# Patient Record
Sex: Female | Born: 1965 | Race: Black or African American | Hispanic: No | Marital: Single | Smoking: Never smoker
Health system: Southern US, Community
[De-identification: ages and names within clinical notes are randomized; demographics above are authoritative.]

## PROBLEM LIST (undated history)

## (undated) DIAGNOSIS — K219 Gastro-esophageal reflux disease without esophagitis: Secondary | ICD-10-CM

## (undated) DIAGNOSIS — R7303 Prediabetes: Secondary | ICD-10-CM

## (undated) DIAGNOSIS — Z87442 Personal history of urinary calculi: Secondary | ICD-10-CM

## (undated) DIAGNOSIS — I890 Lymphedema, not elsewhere classified: Secondary | ICD-10-CM

## (undated) DIAGNOSIS — G4733 Obstructive sleep apnea (adult) (pediatric): Secondary | ICD-10-CM

## (undated) DIAGNOSIS — M1990 Unspecified osteoarthritis, unspecified site: Secondary | ICD-10-CM

## (undated) DIAGNOSIS — I2510 Atherosclerotic heart disease of native coronary artery without angina pectoris: Secondary | ICD-10-CM

## (undated) DIAGNOSIS — I5189 Other ill-defined heart diseases: Secondary | ICD-10-CM

## (undated) DIAGNOSIS — I219 Acute myocardial infarction, unspecified: Secondary | ICD-10-CM

## (undated) DIAGNOSIS — G5600 Carpal tunnel syndrome, unspecified upper limb: Secondary | ICD-10-CM

## (undated) DIAGNOSIS — I10 Essential (primary) hypertension: Secondary | ICD-10-CM

## (undated) DIAGNOSIS — R5383 Other fatigue: Secondary | ICD-10-CM

## (undated) DIAGNOSIS — N3000 Acute cystitis without hematuria: Secondary | ICD-10-CM

## (undated) DIAGNOSIS — K589 Irritable bowel syndrome without diarrhea: Secondary | ICD-10-CM

## (undated) DIAGNOSIS — M79606 Pain in leg, unspecified: Secondary | ICD-10-CM

## (undated) DIAGNOSIS — R0600 Dyspnea, unspecified: Secondary | ICD-10-CM

## (undated) DIAGNOSIS — E559 Vitamin D deficiency, unspecified: Secondary | ICD-10-CM

## (undated) HISTORY — DX: Acute cystitis without hematuria: N30.00

## (undated) HISTORY — DX: Irritable bowel syndrome without diarrhea: K58.9

## (undated) HISTORY — DX: Other fatigue: R53.83

## (undated) HISTORY — DX: Vitamin D deficiency, unspecified: E55.9

## (undated) HISTORY — DX: Pain in leg, unspecified: M79.606

## (undated) HISTORY — PX: COLONOSCOPY: SHX174

## (undated) HISTORY — DX: Lymphedema, not elsewhere classified: I89.0

## (undated) HISTORY — PX: TUBAL LIGATION: SHX77

---

## 2004-06-04 ENCOUNTER — Ambulatory Visit: Payer: Self-pay | Admitting: Obstetrics and Gynecology

## 2004-11-22 ENCOUNTER — Emergency Department: Payer: Self-pay | Admitting: General Practice

## 2008-09-10 ENCOUNTER — Emergency Department: Payer: Self-pay | Admitting: Emergency Medicine

## 2012-08-05 ENCOUNTER — Ambulatory Visit: Payer: Self-pay | Admitting: Obstetrics and Gynecology

## 2013-08-25 DIAGNOSIS — E559 Vitamin D deficiency, unspecified: Secondary | ICD-10-CM

## 2013-08-25 DIAGNOSIS — M79606 Pain in leg, unspecified: Secondary | ICD-10-CM | POA: Insufficient documentation

## 2013-08-25 DIAGNOSIS — R5383 Other fatigue: Secondary | ICD-10-CM | POA: Insufficient documentation

## 2013-08-25 HISTORY — DX: Vitamin D deficiency, unspecified: E55.9

## 2013-08-25 HISTORY — DX: Other fatigue: R53.83

## 2013-08-25 HISTORY — DX: Pain in leg, unspecified: M79.606

## 2013-10-18 ENCOUNTER — Ambulatory Visit: Payer: Self-pay | Admitting: Physician Assistant

## 2014-05-04 ENCOUNTER — Ambulatory Visit: Payer: Self-pay | Admitting: Physician Assistant

## 2014-12-27 DIAGNOSIS — N3000 Acute cystitis without hematuria: Secondary | ICD-10-CM

## 2014-12-27 HISTORY — DX: Acute cystitis without hematuria: N30.00

## 2015-01-15 ENCOUNTER — Other Ambulatory Visit: Payer: Self-pay | Admitting: Obstetrics and Gynecology

## 2015-01-15 DIAGNOSIS — Z1231 Encounter for screening mammogram for malignant neoplasm of breast: Secondary | ICD-10-CM

## 2015-01-19 ENCOUNTER — Ambulatory Visit (INDEPENDENT_AMBULATORY_CARE_PROVIDER_SITE_OTHER): Payer: Federal, State, Local not specified - PPO | Admitting: Urology

## 2015-01-19 ENCOUNTER — Encounter: Payer: Self-pay | Admitting: *Deleted

## 2015-01-19 ENCOUNTER — Encounter: Payer: Self-pay | Admitting: Urology

## 2015-01-19 VITALS — BP 130/85 | HR 75 | Ht 63.0 in | Wt 179.4 lb

## 2015-01-19 DIAGNOSIS — R312 Other microscopic hematuria: Secondary | ICD-10-CM | POA: Diagnosis not present

## 2015-01-19 DIAGNOSIS — R3129 Other microscopic hematuria: Secondary | ICD-10-CM

## 2015-01-19 DIAGNOSIS — K589 Irritable bowel syndrome without diarrhea: Secondary | ICD-10-CM | POA: Insufficient documentation

## 2015-01-19 HISTORY — DX: Irritable bowel syndrome, unspecified: K58.9

## 2015-01-19 NOTE — Progress Notes (Signed)
01/19/2015 3:36 PM   Valerie Braun 11/07/1965 [id number]  Referring provider: No referring provider defined for this encounter.  Chief Complaint  Patient presents with  . Hematuria    HPI: The patient is a 49 year old female who presents for a hematuria workup. The patient has never seen blood in her urine before. However she was noted to have microscopic hematuria at both her PCP and OB/GYN office. Her OB/GYN treated with antibiotics 2, but her microscopic hematuria persisted. She referred here for further workup. She denies any other urinary issues.   PMH: Past Medical History  Diagnosis Date  . 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 Acute cystitis 12/27/2014  . Fatigue 08/25/2013  . Leg pain 08/25/2013  . Avitaminosis D 08/25/2013    Surgical History: Past Surgical History  Procedure Laterality Date  . Tubal ligation      Home Medications:    Medication List       This list is accurate as of: 01/19/15  3:36 PM.  Always use your most recent med list.               esomeprazole 40 MG capsule  Commonly known as:  NEXIUM  1 capsule once daily.     Fish Oil 1000 MG Caps  Take by mouth.     ibuprofen 200 MG tablet  Commonly known as:  ADVIL,MOTRIN  Take by mouth.     LINZESS 145 MCG Caps capsule  Generic drug:  Linaclotide  Take by mouth.     MULTI-VITAMINS Tabs  Take by mouth.     Olopatadine HCl 0.6 % Soln  INSTILL 2 SPRAYS INTO EACH NOSTRIL 2 TIMES A DAY        Allergies:  Allergies  Allergen Reactions  . Other Itching    Red Kool Aid    Family History: Family History  Problem Relation Age of Onset  . Adopted: Yes  . Prostate cancer Maternal Uncle   . Tuberculosis Maternal Grandfather     Social History:  reports that she has never smoked. She does not have any smokeless tobacco history on file. She reports that she does not drink alcohol or use illicit drugs.  ROS: UROLOGY Frequent Urination?: No Hard to postpone urination?:  No Burning/pain with urination?: No Get up at night to urinate?: Yes Leakage of urine?: No Urine stream starts and stops?: No Trouble starting stream?: No Do you have to strain to urinate?: Yes Blood in urine?: No Urinary tract infection?: No Sexually transmitted disease?: No Injury to kidneys or bladder?: No Painful intercourse?: Yes Weak stream?: No Currently pregnant?: No Vaginal bleeding?: No Last menstrual period?: No  Gastrointestinal Nausea?: No Vomiting?: No Indigestion/heartburn?: Yes Diarrhea?: Yes Constipation?: No  Constitutional Fever: No Night sweats?: Yes Weight loss?: No Fatigue?: Yes  Skin Skin rash/lesions?: No Itching?: No  Eyes Blurred vision?: No Double vision?: No  Ears/Nose/Throat Sore throat?: No Sinus problems?: Yes  Hematologic/Lymphatic Swollen glands?: No Easy bruising?: No  Cardiovascular Leg swelling?: Yes Chest pain?: No  Respiratory Cough?: No Shortness of breath?: No  Endocrine Excessive thirst?: Yes  Musculoskeletal Back pain?: Yes Joint pain?: Yes  Neurological Headaches?: No Dizziness?: No  Psychologic Depression?: No Anxiety?: No  Physical Exam: BP 130/85 mmHg  Pulse 75  Ht 5\' 3"  (1.6 m)  Wt 179 lb 6.4 oz (81.375 kg)  BMI 31.79 kg/m2  Constitutional:  Alert and oriented, No acute distress. HEENT: [place] AT, moist mucus membranes.  Trachea midline, no masses. Cardiovascular: No clubbing,  cyanosis, or edema. Respiratory: Normal respiratory effort, no increased work of breathing. GI: Abdomen is soft, nontender, nondistended, no abdominal masses GU: No CVA tenderness.  Skin: No rashes, bruises or suspicious lesions. Lymph: No cervical or inguinal adenopathy. Neurologic: Grossly intact, no focal deficits, moving all 4 extremities. Psychiatric: Normal mood and affect.  Laboratory Data: No results found for: WBC, HGB, HCT, MCV, PLT  No results found for: CREATININE  No results found for: PSA  No  results found for: TESTOSTERONE  No results found for: HGBA1C  Urinalysis No results found for: COLORURINE, APPEARANCEUR, LABSPEC, PHURINE, GLUCOSEU, HGBUR, BILIRUBINUR, KETONESUR, PROTEINUR, UROBILINOGEN, NITRITE, LEUKOCYTESUR  Assessment & Plan:    I discussed the standard workup for microscopic hematuria the patient. She is aware that she'll need to undergo a CT urogram, cystoscopy to complete a full workup.  1. Microscopic hematuria -CT urogram -BMP -cystoscopy -urine culture - Urinalysis, Complete   Return in about 2 weeks (around 02/02/2015) for after ct urogram for cystoscopy.  Nickie Retort, MD  [facility] [address] [place], [place] [address] [phone]

## 2015-01-20 LAB — BASIC METABOLIC PANEL
BUN / CREAT RATIO: 10 (ref 9–23)
BUN: 7 mg/dL (ref 6–24)
CHLORIDE: 102 mmol/L (ref 97–108)
CO2: 25 mmol/L (ref 18–29)
Calcium: 10 mg/dL (ref 8.7–10.2)
Creatinine, Ser: 0.72 mg/dL (ref 0.57–1.00)
GFR calc Af Amer: 113 mL/min/{1.73_m2} (ref >59)
GFR calc non Af Amer: 98 mL/min/{1.73_m2} (ref >59)
GLUCOSE: 88 mg/dL (ref 65–99)
Potassium: 3.3 mmol/L — ABNORMAL LOW (ref 3.5–5.2)
SODIUM: 144 mmol/L (ref 134–144)

## 2015-01-21 LAB — CULTURE, URINE COMPREHENSIVE

## 2015-01-22 LAB — URINALYSIS, COMPLETE
Bilirubin, UA: NEGATIVE
Glucose, UA: NEGATIVE
Ketones, UA: NEGATIVE
Nitrite, UA: NEGATIVE
PH UA: 6 (ref 5.0–7.5)
RBC, UA: NEGATIVE
Specific Gravity, UA: >1.03 — ABNORMAL HIGH (ref 1.005–1.030)
Urobilinogen, Ur: 0.2 mg/dL (ref 0.2–1.0)

## 2015-01-22 LAB — MICROSCOPIC EXAMINATION: Renal Epithel, UA: NONE SEEN /hpf

## 2015-01-29 ENCOUNTER — Ambulatory Visit
Admission: RE | Admit: 2015-01-29 | Discharge: 2015-01-29 | Disposition: A | Discharge status: Home / Self Care | Payer: Federal, State, Local not specified - PPO | Source: Ambulatory Visit | Attending: Urology | Admitting: Urology

## 2015-01-29 DIAGNOSIS — D3501 Benign neoplasm of right adrenal gland: Secondary | ICD-10-CM | POA: Diagnosis not present

## 2015-01-29 DIAGNOSIS — R3129 Other microscopic hematuria: Secondary | ICD-10-CM | POA: Diagnosis present

## 2015-01-29 DIAGNOSIS — N750 Cyst of Bartholin's gland: Secondary | ICD-10-CM | POA: Insufficient documentation

## 2015-01-29 MED ORDER — IOHEXOL 300 MG/ML  SOLN
150.0000 mL | Freq: Once | INTRAMUSCULAR | Status: AC | PRN
  Administered 2015-01-29: 125 mL via INTRAVENOUS

## 2015-01-30 DIAGNOSIS — M8580 Other specified disorders of bone density and structure, unspecified site: Secondary | ICD-10-CM | POA: Insufficient documentation

## 2015-02-06 ENCOUNTER — Encounter: Payer: Self-pay | Admitting: Urology

## 2015-02-06 ENCOUNTER — Ambulatory Visit (INDEPENDENT_AMBULATORY_CARE_PROVIDER_SITE_OTHER): Payer: Federal, State, Local not specified - PPO | Admitting: Urology

## 2015-02-06 VITALS — BP 141/92 | HR 80 | Ht 63.0 in | Wt 181.7 lb

## 2015-02-06 DIAGNOSIS — D3500 Benign neoplasm of unspecified adrenal gland: Secondary | ICD-10-CM | POA: Insufficient documentation

## 2015-02-06 DIAGNOSIS — D3501 Benign neoplasm of right adrenal gland: Secondary | ICD-10-CM | POA: Diagnosis not present

## 2015-02-06 DIAGNOSIS — R3129 Other microscopic hematuria: Secondary | ICD-10-CM | POA: Insufficient documentation

## 2015-02-06 LAB — URINALYSIS, COMPLETE
Bilirubin, UA: NEGATIVE
Glucose, UA: NEGATIVE
Ketones, UA: NEGATIVE
Nitrite, UA: NEGATIVE
PH UA: 8.5 — AB (ref 5.0–7.5)
PROTEIN UA: NEGATIVE
Specific Gravity, UA: 1.015 (ref 1.005–1.030)
Urobilinogen, Ur: 0.2 mg/dL (ref 0.2–1.0)

## 2015-02-06 LAB — MICROSCOPIC EXAMINATION

## 2015-02-06 MED ORDER — LIDOCAINE HCL 2 % EX GEL
1.0000 "application " | Freq: Once | CUTANEOUS | Status: AC
  Administered 2015-02-06: 1 via URETHRAL

## 2015-02-06 MED ORDER — CIPROFLOXACIN HCL 500 MG PO TABS
500.0000 mg | ORAL_TABLET | Freq: Once | ORAL | Status: AC
  Administered 2015-02-06: 500 mg via ORAL

## 2015-02-06 NOTE — Progress Notes (Signed)
02/06/2015 3:28 PM   Valerie Braun 10/18/1965 [id number]  Referring provider: Marinda Elk, MD [place] [facility] [place] [address]  Chief Complaint  Patient presents with  . Cysto    HPI:   1 - Microscopic Hematuria - blood on UA noted by PCP x several. No gross episodes. CT urogram 2016 w/o stones / mass / hydro. Cysto 2016 unremarkable.  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 Prompt contrast washout favors benign etiology. BMP normal. No refractory hypertension or diabetes.  Today " Valerie Braun" is seen for cysto to complete hematuria eval.    PMH: Past Medical History  Diagnosis Date  . 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 Acute cystitis 12/27/2014  . Fatigue 08/25/2013  . Leg pain 08/25/2013  . Avitaminosis D 08/25/2013    Surgical History: Past Surgical History  Procedure Laterality Date  . Tubal ligation      Home Medications:    Medication List       This list is accurate as of: 02/06/15  3:28 PM.  Always use your most recent med list.               esomeprazole 40 MG capsule  Commonly known as:  NEXIUM  1 capsule once daily.     Fish Oil 1000 MG Caps  Take by mouth.     ibuprofen 200 MG tablet  Commonly known as:  ADVIL,MOTRIN  Take by mouth.     LINZESS 145 MCG Caps capsule  Generic drug:  Linaclotide  Take by mouth.     MULTI-VITAMINS Tabs  Take by mouth.     Olopatadine HCl 0.6 % Soln  INSTILL 2 SPRAYS INTO EACH NOSTRIL 2 TIMES A DAY        Allergies:  Allergies  Allergen Reactions  . Other Itching    Red Kool Aid    Family History: Family History  Problem Relation Age of Onset  . Adopted: Yes  . Prostate cancer Maternal Uncle   . Tuberculosis Maternal Grandfather     Social History:  reports that she has never smoked. She does not have any smokeless tobacco history on file. She reports that she does not drink alcohol or use illicit drugs.  ROS:       Review of Systems  Gastrointestinal (upper)  : Negative for upper GI symptoms  Gastrointestinal (lower) : Negative for lower GI symptoms  Constitutional : Negative for symptoms  Skin: Negative for skin symptoms  Eyes: Negative for eye symptoms  Ear/Nose/Throat : Negative for Ear/Nose/Throat symptoms  Hematologic/Lymphatic: Negative for Hematologic/Lymphatic symptoms  Cardiovascular : Negative for cardiovascular symptoms  Respiratory : Negative for respiratory symptoms  Endocrine: Negative for endocrine symptoms  Musculoskeletal: Negative for musculoskeletal symptoms  Neurological: Negative for neurological symptoms  Psychologic: Negative for psychiatric symptoms   Physical Exam: BP 141/92 mmHg  Pulse 80  Ht 5\' 3"  (1.6 m)  Wt 181 lb 11.2 oz (82.419 kg)  BMI 32.19 kg/m2  Constitutional:  Alert and oriented, No acute distress. HEENT: [place] AT, moist mucus membranes.  Trachea midline, no masses. Cardiovascular: No clubbing, cyanosis, or edema. Respiratory: Normal respiratory effort, no increased work of breathing. GI: Abdomen is soft, nontender, nondistended, no abdominal masses GU: No CVA tenderness. No prolapse. No vaginal atrophy Clarise Cruz as chaparone) Skin: No rashes, bruises or suspicious lesions. Lymph: No cervical or inguinal adenopathy. Neurologic: Grossly intact, no focal deficits, moving all 4 extremities. Psychiatric:  Normal mood and affect.  Laboratory Data: No results found for: WBC, HGB, HCT, MCV, PLT  Lab Results  Component Value Date   CREATININE 0.72 01/19/2015    No results found for: PSA  No results found for: TESTOSTERONE  No results found for: HGBA1C  Urinalysis    Component Value Date/Time   GLUCOSEU Negative 01/19/2015 1512   BILIRUBINUR Negative 01/19/2015 1512   NITRITE Negative 01/19/2015 1512   LEUKOCYTESUR 2+* 01/19/2015 1512       Cystoscopy Procedure Note  Patient identification was confirmed, informed  consent was obtained, and patient was prepped using Betadine solution.  Lidocaine jelly was administered per urethral meatus.    Preoperative abx where received prior to procedure.    Procedure: - Flexible cystoscope introduced, without any difficulty.   - Thorough search of the bladder revealed:    normal urethral meatus    normal urothelium    no stones    no ulcers     no tumors    no urethral polyps    no trabeculation  - Ureteral orifices were normal in position and appearance.  Post-Procedure: - Patient tolerated the procedure well    Pertinent Imaging: As per HPI  Assessment & Plan:   1 - Microscopic Hematuria - eval with labs, exam, CT, cysto w/o worrisome etiologies. Reinforced need for repeat eval for future gross episodes only and that microscopic blood may persist.  2 - Rt Adrenal Adenoma - non-functional and <3cm with prompt washout. No indication for further evaluation or surveillance at this point.  3 - RTC 1 year for exam, UA and then prn as long as no interval gross hematuria.    No Follow-up on file.  Alexis Frock, [facility] [address] [place], [place] [address] [phone]

## 2015-05-23 ENCOUNTER — Ambulatory Visit: Payer: Federal, State, Local not specified - PPO | Attending: Student | Admitting: Physical Therapy

## 2015-05-23 ENCOUNTER — Encounter: Payer: Self-pay | Admitting: Physical Therapy

## 2015-05-23 DIAGNOSIS — M25511 Pain in right shoulder: Secondary | ICD-10-CM | POA: Insufficient documentation

## 2015-05-23 DIAGNOSIS — M6281 Muscle weakness (generalized): Secondary | ICD-10-CM | POA: Diagnosis present

## 2015-05-23 DIAGNOSIS — M5412 Radiculopathy, cervical region: Secondary | ICD-10-CM

## 2015-05-24 NOTE — Therapy (Signed)
[facility] [address], [place], [address] Phone: [phone]   Fax:  [phone]  Physical Therapy Evaluation  Patient Details  Name: Valerie Braun MRN: [id number] Date of Birth: [birth year] Referring Provider: Lattie Corns PA  Encounter Date: 05/23/2015      PT End of Session - 05/23/15 1840    Visit Number 1   Number of Visits 12   Date for PT Re-Evaluation 07/05/15   PT Start Time [id number]   PT Stop Time 1840   PT Time Calculation (min) 60 min   Activity Tolerance Patient tolerated treatment well   Behavior During Therapy [facility] for tasks assessed/performed      Past Medical History  Diagnosis Date  . 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 Acute cystitis 12/27/2014  . Fatigue 08/25/2013  . Leg pain 08/25/2013  . Avitaminosis D 08/25/2013    Past Surgical History  Procedure Laterality Date  . Tubal ligation      There were no vitals filed for this visit.  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 Unable to lie on her left side with sleeping and has pain with crossing right arm over to left side to fasten seat belt.    Pertinent History Patient reports that she had insideous onset of upper arm pain in September, 2016. the pain began in upper arm and is currently worsening with symtoms including aching in arm and twitching in right thumb. She was treated conservatively with injection in November with good results and then pain returned in December and she was referred to othorpedist. She has had Xrays of shoulder and had had prednisone without results of reducing pain in right  shoulder/neck. She is now referred to physical therapy.    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 Yes   Pain Score 8   lowest is 7/10   Pain Location Shoulder   Pain Orientation Right   Pain Descriptors / Indicators Aching;Numbness  numbness in right hand   Pain Type Chronic pain   Pain Onset More than a month ago  12/2014   Pain Frequency Constant   Aggravating Factors  using right arm for lifting, delivering mail   Pain Relieving Factors rest, anti inflammatory medication   Effect of Pain on Daily Activities makes it more difficult to perform regular duties at home and work    Multiple Pain Sites No            [facility] PT Assessment - 05/23/15 1749    Assessment   Medical Diagnosis Cervical radiculopathy (M54.12), Biceps tendinitis (M75.21), Rotator cuff tendinitis right (M75.81)   Referring Provider Lattie Corns PA   Onset Date/Surgical Date 12/21/14   Hand Dominance Right   Next MD Visit 06/11/2015   Prior Therapy none   Precautions   Precautions None   Restrictions   Weight Bearing Restrictions No   Balance  Screen   Has the patient fallen in the past 6 months No   Has the patient had a decrease in activity level because of a fear of falling?  No   Is the patient reluctant to leave their home because of a fear of falling?  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, mail carrier    Leisure shopping,   Cognition   Overall Cognitive Status Within Functional Limits for tasks assessed      Objective: AROM cervical spine: flexion: 60, extension: 60, rotation right : 40, left: 70, lateral flexion right: 45, left: 45; right shoulder flexion: WNL, extension: WNL, ER grossly WFL, IR right behind back to L4-5, left to T7 Strength: left UE grossly WNL major muscle groups without pain, right UE shoulder flexion 4/5, ER 4-/5 with pain, IR 4/5, abduction 4-/5 with mild pain, grip strength right 40#, 50#, left: 60#, 65# Palpation: right shoulder with point tenderness over anterior aspect right shoulder with increased thickness over tendons Special tests: cervical spine Spurlings negative for reproduction of symptoms right UE, + impingement sign right shoulder, + Speeds test for biceps right UE  Treatment: instructed patient in self STM right shoulder to decrease pain,performed STM to anterior aspect of right shoulder and upper trapezius, instructed patient in positions to avoid impingement of right shoulder with activities at work, Patient response to treatment: demonstrated good understanding of pain control strategies, self massage of right shoulder and exercises to perform for ROM/posture awareness             PT Education - 05/23/15 1840    Education provided Yes   Education Details posture awareness, positioning right shoulder to avoid impingement, ROM exercises right shoulder, cervical spine, self STM right shoulder   Person(s) Educated Patient   Methods Explanation;Demonstration;Verbal cues   Comprehension Verbalized understanding;Returned demonstration;Verbal cues required             PT Long Term Goals - 05/23/15 1840    PT LONG TERM GOAL #1   Title Patient will demonstrate improved function with decreased pain in neck as indicated by NDI score of 40% or less by 07/05/2015   Baseline NDI score 52%   Status New   PT LONG TERM GOAL #2   Title Patient will  demonstrate improved function with decreased pain in right shoulder by quickDash score of 64% or less by 07/05/2015   Baseline QuickDash score = 84%   Status New   PT LONG TERM GOAL #3   Title Patient will be independent with home program for pain control, exercise progression for strength and flexibility cervical spine, right shoulder/UE by 07/05/2015 in order to progress towards prior level of funciton    Baseline limited knowledge of appropriate pain control strategies and progression of exercises   Status New               Plan - 05/23/15 1835    Clinical Impression Statement Patient is a right hand dominant 50 year old female who presents with insideous onset of pain in neck and right shoulder that has gotten progressively worse. She currently c/o pain  in right upper arm, stiffness in right upper trapezius/ side of neck. 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 She is limited in ability to sleep, perform work related tasks using right UE and opening jars. She has limited knowledge of appropriate pain control strategies and progression of exercises to return to prior level of funciton.    Pt will benefit from skilled therapeutic intervention in order to improve on the following deficits Decreased strength;Pain;Impaired UE functional use;Increased muscle spasms;Decreased range of motion   Rehab Potential Good   Clinical Impairments Affecting Rehab Potential (+) motivated, acute condition    PT Frequency 2x / week   PT Duration 6 weeks   PT Treatment/Interventions Therapeutic exercise;Moist Heat;Iontophoresis 4mg /ml Dexamethasone;Dry needling;Electrical Stimulation;Cryotherapy;Patient/family education;Neuromuscular re-education;Ultrasound;Manual techniques   PT Next Visit Plan pain control, manual therapy techniques, exercises instruction   PT Home Exercise Plan posture  awareness, avoid impingement positions, ROM for shoulder, cervical spine   Consulted and Agree with Plan of Care Patient         Problem List Patient Active Problem List   Diagnosis Date Noted  . Microscopic hematuria 02/06/2015  . 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 Osteopenia 01/30/2015  . 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 Acute cystitis 12/27/2014  . Fatigue 08/25/2013  . Leg pain 08/25/2013  . Avitaminosis D 08/25/2013    Jomarie Longs PT 05/24/2015, 10:38 PM  [facility] [address] [place], [address] Phone: [phone]   Fax:  [phone]  Name: Valerie Braun MRN: [id number] Date of Birth: [birth year]

## 2015-05-28 ENCOUNTER — Ambulatory Visit: Payer: Federal, State, Local not specified - PPO | Admitting: Physical Therapy

## 2015-05-28 ENCOUNTER — Encounter: Payer: Self-pay | Admitting: Physical Therapy

## 2015-05-28 DIAGNOSIS — M5412 Radiculopathy, cervical region: Secondary | ICD-10-CM | POA: Diagnosis not present

## 2015-05-28 DIAGNOSIS — M25511 Pain in right shoulder: Secondary | ICD-10-CM

## 2015-05-28 DIAGNOSIS — M6281 Muscle weakness (generalized): Secondary | ICD-10-CM

## 2015-05-28 NOTE — Therapy (Signed)
[facility] [address], [place], [address] Phone: [phone]   Fax:  [phone]  Physical Therapy Treatment  Patient Details  Name: Valerie Braun MRN: [id number] Date of Birth: 12-26-1965 Referring Provider: Lattie Corns PA  Encounter Date: 05/28/2015      PT End of Session - 05/28/15 1900    Visit Number 2   Number of Visits 12   Date for PT Re-Evaluation 07/05/15   PT Start Time [id number]   PT Stop Time 1901   PT Time Calculation (min) 30 min   Activity Tolerance Patient tolerated treatment well   Behavior During Therapy [facility] for tasks assessed/performed      Past Medical History  Diagnosis Date  . 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 Acute cystitis 12/27/2014  . Fatigue 08/25/2013  . Leg pain 08/25/2013  . Avitaminosis D 08/25/2013    Past Surgical History  Procedure Laterality Date  . Tubal ligation      There were no vitals filed for this visit.  Visit Diagnosis:  Cervical radiculitis  Right shoulder pain  Muscle weakness      Subjective Assessment - 05/28/15 1832    Subjective Patient reports throbbing pain in proximal right arm. She is still having difficulty with sleeping and lifting activities. 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   Limitations Lifting;House hold activities;Other (comment)  delivering maily   Patient Stated Goals would like to sleep better, decrease numbness in right hand (whole hand), decreased pain in right arm   Currently in Pain? Yes   Pain Score 10-Worst pain ever   Pain Location Shoulder   Pain Orientation Right   Pain Descriptors / Indicators Aching  numbness in right hand   Pain Type Chronic pain   Pain Onset More than a month ago  12/2014       Objective:  Right shoulder: palpation: point tender over biceps tendon anterior aspect of shoulder, + spasms palpable right upper trapezius, cervical spine        [facility] Adult PT Treatment/Exercise - 05/28/15 1841    Exercise   Other exercises patient performed exercises with guidance, verbal and tactile cues and demonstration of PT: Pendulum exercises with correct technique, side lying scapular elevation/depression and protraction/retraction, supine lying AAROM right shoulder, scapular rows in sitting/standing with controlled motion and tactile cues     manual therapy: STM performed by therapist superficial techniques to improve soft tissue elasticity right upper trapezius, cervical spine paraspinal muscles, biceps tendon, scapular mobilization in side lying, upper trapezius release in side lying position 3 x 10 seconds  Patient response to treatment: decreased pain to mild and more flexible in right shoulder, patient demonstrated good technique with exercises following instruction with verbal cuing         PT Education - 05/28/15 1903    Education provided Yes   Education Details HEP for pendulums, pain control and scapular control/rows   Person(s) Educated Patient   Methods Explanation;Demonstration;Verbal cues   Comprehension Verbalized understanding;Returned demonstration;Verbal cues required             PT Long Term Goals - 05/23/15 1840    PT LONG TERM GOAL #1   Title Patient will demonstrate improved function with decreased pain in neck as indicated by NDI score of 40% or less by 07/05/2015   Baseline NDI score 52%   Status New   PT LONG TERM GOAL #2  Title Patient will demonstrate improved function with decreased pain in right shoulder by quickDash score of 64% or less by 07/05/2015   Baseline QuickDash score = 84%   Status New   PT LONG TERM GOAL #3   Title Patient will be independent with home program for pain control, exercise progression for strength and flexibility cervical spine, right shoulder/UE by 07/05/2015 in order to progress towards prior level of funciton    Baseline limited knowledge of appropriate pain control  strategies and progression of exercises   Status New               Plan - 05/28/15 1904    Clinical Impression Statement Patient demonstrated decreased pain in right shoulder and improved scapular control with good demonstration of exercises following session today. She continues with pain in right shoulder and will benefit from additional physicla therapy to achieve goals.    Pt will benefit from skilled therapeutic intervention in order to improve on the following deficits Decreased strength;Pain;Impaired UE functional use;Increased muscle spasms;Decreased range of motion   Rehab Potential Good   PT Frequency 2x / week   PT Duration 6 weeks   PT Treatment/Interventions Therapeutic exercise;Moist Heat;Iontophoresis 4mg /ml Dexamethasone;Dry needling;Electrical Stimulation;Cryotherapy;Patient/family education;Neuromuscular re-education;Ultrasound;Manual techniques   PT Next Visit Plan pain control, manual therapy techniques, exercises instruction   PT Home Exercise Plan posture awareness, avoid impingement positions, ROM for shoulder, cervical spine        Problem List Patient Active Problem List   Diagnosis Date Noted  . Microscopic hematuria 02/06/2015  . 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 Osteopenia 01/30/2015  . 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 Acute cystitis 12/27/2014  . Fatigue 08/25/2013  . Leg pain 08/25/2013  . Avitaminosis D 08/25/2013    Jomarie Longs PT 05/29/2015, 8:53 PM  [facility] [address], [place], [address] Phone: [phone]   Fax:  [phone]  Name: Valerie Braun MRN: [id number] Date of Birth: [birth year]

## 2015-05-30 ENCOUNTER — Encounter: Payer: Self-pay | Admitting: Physical Therapy

## 2015-05-30 ENCOUNTER — Ambulatory Visit: Payer: Federal, State, Local not specified - PPO | Admitting: Physical Therapy

## 2015-05-30 DIAGNOSIS — M25511 Pain in right shoulder: Secondary | ICD-10-CM

## 2015-05-30 DIAGNOSIS — M6281 Muscle weakness (generalized): Secondary | ICD-10-CM

## 2015-05-30 DIAGNOSIS — M5412 Radiculopathy, cervical region: Secondary | ICD-10-CM

## 2015-05-30 NOTE — Therapy (Signed)
[facility] [address], [place], [address] Phone: [phone]   Fax:  [phone]  Physical Therapy Treatment  Patient Details  Name: Valerie Braun MRN: [id number] Date of Birth: June 29, 1965 Referring Provider: Lattie Corns PA  Encounter Date: 05/30/2015      PT End of Session - 05/30/15 1906    Visit Number 3   Number of Visits 12   Date for PT Re-Evaluation 07/05/14   PT Start Time 1828   PT Stop Time 1859   PT Time Calculation (min) 31 min   Activity Tolerance Patient tolerated treatment well   Behavior During Therapy [facility] for tasks assessed/performed      Past Medical History  Diagnosis Date  . 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 Acute cystitis 12/27/2014  . Fatigue 08/25/2013  . Leg pain 08/25/2013  . Avitaminosis D 08/25/2013    Past Surgical History  Procedure Laterality Date  . Tubal ligation      There were no vitals filed for this visit.  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    Limitations Lifting;House hold activities;Other (comment)  Mail route   Patient Stated Goals would like to sleep better, decrease numbness in right hand (whole hand), decreased pain in right arm   Pain Score 8    Pain Location Shoulder   Pain Orientation Right   Pain Descriptors / Indicators Aching   Pain Type Chronic pain             Adult PT Treatment/Exercise - 05/28/15 1841    Exercise   Other exercises patient performed exercises with guidance, verbal and tactile cues and demonstration of PT: Scapular Retractions: 2 x 15 #10 Scapular Retractions: x10 (unweighted) --Required frequent tactile and verbal cueing to perform. Shoulder extension straight arm at  machine-- x15 #5     Manual therapy: STM performed by therapist superficial and deep techniques to improve soft tissue elasticity right upper trapezius, scapular mobilization in side lying, upper trapezius release in side lying position 5 x 15 seconds  Patient response to treatment: During therapy patient required frequent verbal and tactile cueing to perform scapular motions. Pt with decreased pain in right upper trap after performing manual techniques. Pain is further improved after performing therapeutic exercise most noted after scapular retractions secondary to reciprocal inhibition due to lower trap activation.                  PT Education - 05/30/15 1905    Education provided Yes   Education Details HEP for scapular retractions with yellow band, straight arm shoulder extensions with yellow band, and performing unweighted shoulder retractions throughout the day to assist with pain relief.    Person(s) Educated Patient   Methods Explanation;Demonstration;Tactile cues;Verbal cues;Handout   Comprehension Verbalized understanding;Returned demonstration;Tactile cues required             PT Long Term Goals - 05/23/15 1840    PT LONG TERM GOAL #1   Title Patient will demonstrate improved function with decreased pain in neck as indicated by NDI score of 40% or less by 07/05/2015   Baseline NDI score 52%   Status New   PT LONG TERM GOAL #2   Title Patient will demonstrate improved function  with decreased pain in right shoulder by quickDash score of 64% or less by 07/05/2015   Baseline QuickDash score = 84%   Status New   PT LONG TERM GOAL #3   Title Patient will be independent with home program for pain control, exercise progression for strength and flexibility cervical spine, right shoulder/UE by 07/05/2015 in order to progress towards prior level of funciton    Baseline limited knowledge of appropriate pain control strategies and progression of exercises   Status New                Plan - 05/30/15 1910    Clinical Impression Statement Patient with improved scapular movement today with improved performance when performing scapular retraction exercise. Continues to exhibit right shoulder pain and decreased motor control and endurance of the scapular/shoulder musculature and will benefit from further skilled therapy to return to prior level of function.    Pt will benefit from skilled therapeutic intervention in order to improve on the following deficits Decreased strength;Pain;Impaired UE functional use;Increased muscle spasms;Decreased range of motion   Rehab Potential Good   Clinical Impairments Affecting Rehab Potential (+) motivated, acute condition    PT Frequency 2x / week   PT Duration 6 weeks   PT Treatment/Interventions Therapeutic exercise;Moist Heat;Iontophoresis 4mg /ml Dexamethasone;Dry needling;Electrical Stimulation;Cryotherapy;Patient/family education;Neuromuscular re-education;Ultrasound;Manual techniques   PT Next Visit Plan pain control, manual therapy techniques, exercises instruction   PT Home Exercise Plan posture awareness, avoid impingement positions, ROM for shoulder, cervical spine        Problem List Patient Active Problem List   Diagnosis Date Noted  . Microscopic hematuria 02/06/2015  . 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 Osteopenia 01/30/2015  . 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 Acute cystitis 12/27/2014  . Fatigue 08/25/2013  . Leg pain 08/25/2013  . Avitaminosis D 08/25/2013    Blythe Stanford, SPT 05/30/2015, 7:16 PM  [place] [facility] [address], [place], [address] Phone: [phone]   Fax:  [phone]  Name: KEELAN GALLIGHER MRN: [id number] Date of Birth: 1966-01-31

## 2015-06-05 ENCOUNTER — Ambulatory Visit: Payer: Federal, State, Local not specified - PPO | Admitting: Physical Therapy

## 2015-06-05 ENCOUNTER — Encounter: Payer: Self-pay | Admitting: Physical Therapy

## 2015-06-05 DIAGNOSIS — M6281 Muscle weakness (generalized): Secondary | ICD-10-CM

## 2015-06-05 DIAGNOSIS — M25511 Pain in right shoulder: Secondary | ICD-10-CM

## 2015-06-05 DIAGNOSIS — M5412 Radiculopathy, cervical region: Secondary | ICD-10-CM | POA: Diagnosis not present

## 2015-06-05 NOTE — Therapy (Signed)
[place] [facility] [address], [place], [address] Phone: [phone]   Fax:  [phone]  Physical Therapy Treatment  Patient Details  Name: Valerie Braun MRN: [id number] Date of Birth: November 16, 1965 Referring Provider: Lattie Corns PA  Encounter Date: 06/05/2015      PT End of Session - 06/05/15 2008    Visit Number 3   Number of Visits 12   Date for PT Re-Evaluation 07/05/14   PT Start Time 1852   PT Stop Time 1938   PT Time Calculation (min) 46 min   Activity Tolerance Patient tolerated treatment well   Behavior During Therapy [facility] for tasks assessed/performed      Past Medical History  Diagnosis Date  . 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 Acute cystitis 12/27/2014  . Fatigue 08/25/2013  . Leg pain 08/25/2013  . Avitaminosis D 08/25/2013    Past Surgical History  Procedure Laterality Date  . Tubal ligation      There were no vitals filed for this visit.  Visit Diagnosis:  No diagnosis found.      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    Limitations Lifting;House hold activities;Other (comment)   Patient Stated Goals would like to sleep better, decrease numbness in right hand (whole hand), decreased pain in right arm   Currently in Pain? Yes   Pain Score 7    Pain Location Shoulder   Pain Orientation Right   Pain Descriptors / Indicators Aching   Pain Type Chronic pain   Pain Onset More than a month ago      Objective:   AROM cervical spine: flexion: 60, extension: 60, rotation right : 55, left: 60, lateral flexion right: 45, left: 45; right shoulder flexion: WNL, extension: WNL, ER grossly WFL, IR right behind back to T9-12, left to T7 Strength: left UE grossly WNL major muscle groups without pain, right UE shoulder flexion 5/5, ER 5/5 , IR 5/5,  abduction 5/5, grip strength right 68#, 60#, left: 60#, 60# Palpation: right shoulder with point tenderness over anterior aspect right shoulder with increased thickness over tendons Special tests: cervical spine Spurlings negative for reproduction of symptoms right UE, + impingement sign right shoulder, + Speeds test for biceps right UE   Other exercisespatient performed exercises with guidance, verbal and tactile cues and demonstration of PT: Scapular Retractions: 2 x 15 #10 Shoulder extension straight arm at machine-- 15 #15      Manual therapy: STM performed by therapist with the patient in sitting using superficial and deep techniques to improve soft tissue elasticity right upper trapezius.  Patient response to treatment:  muscle spasms decreased >50% following manual therapy techniques allowing the patient to perform exercises without pain. The patient required moderate tactile and verbal cueing to perform scapular retraction exercise with proper joint alignment and technique indicating decreased motor control.         PT Education - 06/05/15 2000    Education provided Yes   Education Details HEP: scapular retractions, straight arm shoulder extension, non resisted shoulder retractions.   Person(s) Educated Patient   Methods Explanation;Demonstration;Tactile cues   Comprehension Verbalized understanding             PT Long Term Goals - 05/23/15 1840    PT LONG TERM GOAL #1   Title Patient will demonstrate improved function  with decreased pain in neck as indicated by NDI score of 40% or less by 07/05/2015   Baseline NDI score 52%   Status New   PT LONG TERM GOAL #2   Title Patient will demonstrate improved function with decreased pain in right shoulder by quickDash score of 64% or less by 07/05/2015   Baseline QuickDash score = 84%   Status New   PT LONG TERM GOAL #3   Title Patient will be independent with home program for pain control, exercise progression for strength  and flexibility cervical spine, right shoulder/UE by 07/05/2015 in order to progress towards prior level of funciton    Baseline limited knowledge of appropriate pain control strategies and progression of exercises   Status New               Plan - 06/05/15 2011    Clinical Impression Statement Patient demonstrated increased strength and AROM in shoulders bilaterally indicating functional improvement towards long term goals. Focussed on decreasing muscle spasms and pain in the cervical region today. Patient continues to demonstrate increased muscle guarding to cervical extensors/ upper trapezius muscles requiring manual therapy to correct. She will benefit from further skilled therapy focusing on increasing muscular endurance and improving motor control to return to prior level of function.     Pt will benefit from skilled therapeutic intervention in order to improve on the following deficits Decreased strength;Pain;Impaired UE functional use;Increased muscle spasms;Decreased range of motion   Rehab Potential Good   Clinical Impairments Affecting Rehab Potential (+) motivated, acute condition    PT Frequency 2x / week   PT Duration 6 weeks   PT Treatment/Interventions Therapeutic exercise;Moist Heat;Iontophoresis 4mg /ml Dexamethasone;Dry needling;Electrical Stimulation;Cryotherapy;Patient/family education;Neuromuscular re-education;Ultrasound;Manual techniques   PT Next Visit Plan pain control, manual therapy techniques, exercises instruction   PT Home Exercise Plan posture awareness, avoid impingement positions, ROM for shoulder, cervical spine   Consulted and Agree with Plan of Care Patient        Problem List Patient Active Problem List   Diagnosis Date Noted  . Microscopic hematuria 02/06/2015  . 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 Osteopenia 01/30/2015  . 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 Acute cystitis 12/27/2014  . Fatigue 08/25/2013  . Leg pain 08/25/2013  . Avitaminosis  D 08/25/2013    Blythe Stanford, SPT 06/06/2015, 8:31 AM  [place] [address], [place], [address] Phone: [phone]   Fax:  [phone]  Name: Valerie Braun MRN: [id number] Date of Birth: January 10, 1966

## 2015-06-06 ENCOUNTER — Encounter: Payer: Federal, State, Local not specified - PPO | Admitting: Physical Therapy

## 2015-06-14 ENCOUNTER — Ambulatory Visit: Payer: Federal, State, Local not specified - PPO | Admitting: Physical Therapy

## 2015-06-20 ENCOUNTER — Encounter: Payer: Federal, State, Local not specified - PPO | Admitting: Physical Therapy

## 2015-06-26 ENCOUNTER — Encounter: Payer: Federal, State, Local not specified - PPO | Admitting: Physical Therapy

## 2015-07-02 ENCOUNTER — Encounter: Payer: Federal, State, Local not specified - PPO | Admitting: Physical Therapy

## 2015-07-04 ENCOUNTER — Encounter: Payer: Federal, State, Local not specified - PPO | Admitting: Physical Therapy

## 2015-09-28 ENCOUNTER — Other Ambulatory Visit: Payer: Self-pay | Admitting: Student

## 2015-09-28 DIAGNOSIS — M5412 Radiculopathy, cervical region: Secondary | ICD-10-CM

## 2015-10-26 ENCOUNTER — Ambulatory Visit
Admission: RE | Admit: 2015-10-26 | Discharge: 2015-10-26 | Disposition: A | Discharge status: Home / Self Care | Payer: Federal, State, Local not specified - PPO | Source: Ambulatory Visit | Attending: Student | Admitting: Student

## 2015-10-26 ENCOUNTER — Ambulatory Visit: Payer: Federal, State, Local not specified - PPO

## 2015-10-26 DIAGNOSIS — M50322 Other cervical disc degeneration at C5-C6 level: Secondary | ICD-10-CM | POA: Diagnosis not present

## 2015-10-26 DIAGNOSIS — M5412 Radiculopathy, cervical region: Secondary | ICD-10-CM | POA: Insufficient documentation

## 2016-01-04 ENCOUNTER — Ambulatory Visit
Admission: RE | Admit: 2016-01-04 | Discharge: 2016-01-04 | Disposition: A | Discharge status: Home / Self Care | Payer: Federal, State, Local not specified - PPO | Source: Ambulatory Visit | Attending: Obstetrics and Gynecology | Admitting: Obstetrics and Gynecology

## 2016-01-04 ENCOUNTER — Other Ambulatory Visit: Payer: Self-pay | Admitting: Obstetrics and Gynecology

## 2016-01-04 DIAGNOSIS — Z1231 Encounter for screening mammogram for malignant neoplasm of breast: Secondary | ICD-10-CM | POA: Insufficient documentation

## 2016-02-08 ENCOUNTER — Ambulatory Visit: Payer: Federal, State, Local not specified - PPO | Admitting: Urology

## 2016-05-30 DIAGNOSIS — B351 Tinea unguium: Secondary | ICD-10-CM | POA: Diagnosis not present

## 2016-05-30 DIAGNOSIS — Z6830 Body mass index (BMI) 30.0-30.9, adult: Secondary | ICD-10-CM | POA: Diagnosis not present

## 2016-07-04 DIAGNOSIS — G5601 Carpal tunnel syndrome, right upper limb: Secondary | ICD-10-CM | POA: Diagnosis not present

## 2016-08-05 DIAGNOSIS — M7581 Other shoulder lesions, right shoulder: Secondary | ICD-10-CM | POA: Diagnosis not present

## 2016-08-05 DIAGNOSIS — M7521 Bicipital tendinitis, right shoulder: Secondary | ICD-10-CM | POA: Diagnosis not present

## 2016-10-02 DIAGNOSIS — Z0000 Encounter for general adult medical examination without abnormal findings: Secondary | ICD-10-CM | POA: Diagnosis not present

## 2016-10-09 DIAGNOSIS — J3089 Other allergic rhinitis: Secondary | ICD-10-CM | POA: Diagnosis not present

## 2016-10-09 DIAGNOSIS — Z0000 Encounter for general adult medical examination without abnormal findings: Secondary | ICD-10-CM | POA: Diagnosis not present

## 2016-10-09 DIAGNOSIS — Z6830 Body mass index (BMI) 30.0-30.9, adult: Secondary | ICD-10-CM | POA: Diagnosis not present

## 2016-10-09 DIAGNOSIS — K581 Irritable bowel syndrome with constipation: Secondary | ICD-10-CM | POA: Diagnosis not present

## 2016-11-20 DIAGNOSIS — K080 Exfoliation of teeth due to systemic causes: Secondary | ICD-10-CM | POA: Diagnosis not present

## 2016-12-02 DIAGNOSIS — M1611 Unilateral primary osteoarthritis, right hip: Secondary | ICD-10-CM | POA: Diagnosis not present

## 2016-12-16 DIAGNOSIS — Z01419 Encounter for gynecological examination (general) (routine) without abnormal findings: Secondary | ICD-10-CM | POA: Diagnosis not present

## 2016-12-16 DIAGNOSIS — Z1211 Encounter for screening for malignant neoplasm of colon: Secondary | ICD-10-CM | POA: Diagnosis not present

## 2016-12-16 DIAGNOSIS — R232 Flushing: Secondary | ICD-10-CM | POA: Diagnosis not present

## 2016-12-16 DIAGNOSIS — Z1231 Encounter for screening mammogram for malignant neoplasm of breast: Secondary | ICD-10-CM | POA: Diagnosis not present

## 2017-01-06 DIAGNOSIS — K219 Gastro-esophageal reflux disease without esophagitis: Secondary | ICD-10-CM | POA: Diagnosis not present

## 2017-01-06 DIAGNOSIS — K5909 Other constipation: Secondary | ICD-10-CM | POA: Diagnosis not present

## 2017-01-08 DIAGNOSIS — K581 Irritable bowel syndrome with constipation: Secondary | ICD-10-CM | POA: Diagnosis not present

## 2017-01-08 DIAGNOSIS — E663 Overweight: Secondary | ICD-10-CM | POA: Diagnosis not present

## 2017-01-08 DIAGNOSIS — R5383 Other fatigue: Secondary | ICD-10-CM | POA: Diagnosis not present

## 2017-01-15 DIAGNOSIS — R112 Nausea with vomiting, unspecified: Secondary | ICD-10-CM | POA: Diagnosis not present

## 2017-01-15 DIAGNOSIS — R197 Diarrhea, unspecified: Secondary | ICD-10-CM | POA: Diagnosis not present

## 2017-01-15 DIAGNOSIS — R42 Dizziness and giddiness: Secondary | ICD-10-CM | POA: Diagnosis not present

## 2017-01-20 DIAGNOSIS — K589 Irritable bowel syndrome without diarrhea: Secondary | ICD-10-CM | POA: Diagnosis not present

## 2017-01-26 ENCOUNTER — Other Ambulatory Visit: Payer: Self-pay | Admitting: Otolaryngology

## 2017-01-26 ENCOUNTER — Other Ambulatory Visit: Payer: Self-pay | Admitting: Obstetrics and Gynecology

## 2017-01-26 DIAGNOSIS — H9011 Conductive hearing loss, unilateral, right ear, with unrestricted hearing on the contralateral side: Secondary | ICD-10-CM

## 2017-01-26 DIAGNOSIS — Z1231 Encounter for screening mammogram for malignant neoplasm of breast: Secondary | ICD-10-CM

## 2017-01-26 DIAGNOSIS — R42 Dizziness and giddiness: Secondary | ICD-10-CM | POA: Diagnosis not present

## 2017-01-26 DIAGNOSIS — H8090 Unspecified otosclerosis, unspecified ear: Secondary | ICD-10-CM | POA: Diagnosis not present

## 2017-01-26 DIAGNOSIS — H9071 Mixed conductive and sensorineural hearing loss, unilateral, right ear, with unrestricted hearing on the contralateral side: Secondary | ICD-10-CM | POA: Diagnosis not present

## 2017-02-03 ENCOUNTER — Ambulatory Visit
Admission: RE | Admit: 2017-02-03 | Discharge: 2017-02-03 | Disposition: A | Discharge status: Home / Self Care | Payer: Federal, State, Local not specified - PPO | Source: Ambulatory Visit | Attending: Obstetrics and Gynecology | Admitting: Obstetrics and Gynecology

## 2017-02-03 ENCOUNTER — Ambulatory Visit
Admission: RE | Admit: 2017-02-03 | Discharge: 2017-02-03 | Disposition: A | Discharge status: Home / Self Care | Payer: Federal, State, Local not specified - PPO | Source: Ambulatory Visit | Attending: Otolaryngology | Admitting: Otolaryngology

## 2017-02-03 DIAGNOSIS — H900 Conductive hearing loss, bilateral: Secondary | ICD-10-CM | POA: Diagnosis not present

## 2017-02-03 DIAGNOSIS — H9011 Conductive hearing loss, unilateral, right ear, with unrestricted hearing on the contralateral side: Secondary | ICD-10-CM | POA: Insufficient documentation

## 2017-02-03 DIAGNOSIS — Z1231 Encounter for screening mammogram for malignant neoplasm of breast: Secondary | ICD-10-CM | POA: Insufficient documentation

## 2017-02-05 DIAGNOSIS — H906 Mixed conductive and sensorineural hearing loss, bilateral: Secondary | ICD-10-CM | POA: Diagnosis not present

## 2017-02-05 DIAGNOSIS — R42 Dizziness and giddiness: Secondary | ICD-10-CM | POA: Diagnosis not present

## 2017-05-12 DIAGNOSIS — L731 Pseudofolliculitis barbae: Secondary | ICD-10-CM | POA: Diagnosis not present

## 2017-05-12 DIAGNOSIS — G5601 Carpal tunnel syndrome, right upper limb: Secondary | ICD-10-CM | POA: Diagnosis not present

## 2017-05-12 DIAGNOSIS — N760 Acute vaginitis: Secondary | ICD-10-CM | POA: Diagnosis not present

## 2017-05-12 DIAGNOSIS — D229 Melanocytic nevi, unspecified: Secondary | ICD-10-CM | POA: Diagnosis not present

## 2017-05-12 DIAGNOSIS — E669 Obesity, unspecified: Secondary | ICD-10-CM | POA: Diagnosis not present

## 2017-06-08 DIAGNOSIS — M7581 Other shoulder lesions, right shoulder: Secondary | ICD-10-CM | POA: Diagnosis not present

## 2017-06-08 DIAGNOSIS — G5601 Carpal tunnel syndrome, right upper limb: Secondary | ICD-10-CM | POA: Diagnosis not present

## 2017-06-12 ENCOUNTER — Ambulatory Visit (INDEPENDENT_AMBULATORY_CARE_PROVIDER_SITE_OTHER): Payer: Federal, State, Local not specified - PPO | Admitting: Vascular Surgery

## 2017-06-12 ENCOUNTER — Encounter (INDEPENDENT_AMBULATORY_CARE_PROVIDER_SITE_OTHER): Payer: Self-pay | Admitting: Vascular Surgery

## 2017-06-12 VITALS — BP 133/85 | HR 68 | Resp 17 | Wt 163.0 lb

## 2017-06-12 DIAGNOSIS — R600 Localized edema: Secondary | ICD-10-CM | POA: Diagnosis not present

## 2017-06-12 DIAGNOSIS — M79604 Pain in right leg: Secondary | ICD-10-CM | POA: Diagnosis not present

## 2017-06-12 DIAGNOSIS — I83813 Varicose veins of bilateral lower extremities with pain: Secondary | ICD-10-CM | POA: Diagnosis not present

## 2017-06-12 DIAGNOSIS — M79605 Pain in left leg: Secondary | ICD-10-CM

## 2017-06-12 NOTE — Progress Notes (Signed)
Subjective:    Patient ID: Valerie Braun, female    DOB: 07-19-1965, 52 y.o.   MRN: [id number] Chief Complaint  Patient presents with  . Follow-up    discuss possible addtional sclerotherapy   Patient presents today for evaluation of painful varicose veins.  The patient states she was last seen in 2017 and underwent sclerotherapy for treatment of painful varicose veins to the left lower extremity.  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  The patient has been wearing medical grade 1 compression stockings and elevating her legs on a daily basis since her initial visit and would not practice years ago.  Over the last few months, the patient has noticed a progressive worsening in the discomfort along her varicosities.  The patient also notes experiencing bilateral lower extremity edema.  The patient's painful varicosities and edema have progressed to the point that she is unable to function on a daily basis.  She considers her symptoms lifestyle limiting.  The patient denies any recent trauma or surgery to the bilateral lower extremity.  The patient denies any DVT history to the bilateral lower extremity.  The patient denies any fever, nausea vomiting.  The patient denies any claudication-like symptoms, rest pain or ulceration to the bilateral lower extremity.   Review of Systems  Constitutional: Negative.   HENT: Negative.   Eyes: Negative.   Respiratory: Negative.   Cardiovascular: Positive for leg swelling.       Painful varicose veins  Gastrointestinal: Negative.   Endocrine: Negative.   Genitourinary: Negative.   Musculoskeletal: Negative.   Skin: Negative.   Allergic/Immunologic: Negative.   Neurological: Negative.   Hematological: Negative.   Psychiatric/Behavioral: Negative.       Objective:   Physical Exam  Constitutional: She is oriented to person, place, and time. She appears well-developed. No distress.  HENT:  Head: Normocephalic  and atraumatic.  Eyes: Conjunctivae are normal. Pupils are equal, round, and reactive to light.  Neck: Normal range of motion.  Cardiovascular: Normal rate, regular rhythm, normal heart sounds and intact distal pulses.  Pulses:      Radial pulses are 2+ on the right side, and 2+ on the left side.       Dorsalis pedis pulses are 2+ on the right side, and 2+ on the left side.       Posterior tibial pulses are 2+ on the right side, and 2+ on the left side.  Pulmonary/Chest: Effort normal and breath sounds normal.  Musculoskeletal: Normal range of motion. She exhibits edema (Mild nonpitting bilateral lower extremity edema).  Neurological: She is alert and oriented to person, place, and time.  Skin: Skin is warm and dry. She is not diaphoretic.  Less than 1 cm scattered varicosities to the bilateral lower extremity.  There is no stasis dermatitis, cellulitis, skin changes to the bilateral lower extremity.  Psychiatric: She has a normal mood and affect. Her behavior is normal. Judgment and thought content normal.  Vitals reviewed.  BP 133/85 (BP Location: Right Arm)   Pulse 68   Resp 17   Wt 163 lb (73.9 kg)   BMI 28.87 kg/m   Past Medical History:  Diagnosis Date  . Acute cystitis 12/27/2014  . 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 Avitaminosis D 08/25/2013  . Fatigue 08/25/2013  . Leg pain 08/25/2013   Social History   Socioeconomic History  . Marital status: Single    Spouse name: Not on file  .  Number of children: Not on file  . Years of education: Not on file  . Highest education level: Not on file  Social Needs  . Financial resource strain: Not on file  . Food insecurity - worry: Not on file  . Food insecurity - inability: Not on file  . Transportation needs - medical: Not on file  . Transportation needs - non-medical: Not on file  Occupational History  . Not on file  Tobacco Use  . Smoking status: Never Smoker  . Smokeless tobacco: Never Used  Substance and Sexual Activity  .  Alcohol use: No    Alcohol/week: 0.0 oz  . Drug use: No  . Sexual activity: Not on file  Other Topics Concern  . Not on file  Social History Narrative  . Not on file   Past Surgical History:  Procedure Laterality Date  . TUBAL LIGATION     Family History  Adopted: Yes  Problem Relation Age of Onset  . Prostate cancer Maternal Uncle   . Tuberculosis Maternal Grandfather    Allergies  Allergen Reactions  . Other Itching    Red Kool Aid      Assessment & Plan:  Patient presents today for evaluation of painful varicose veins.  The patient states she was last seen in 2017 and underwent sclerotherapy for treatment of painful varicose veins to the left lower extremity.  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  The patient has been wearing medical grade 1 compression stockings and elevating her legs on a daily basis since her initial visit and would not practice years ago.  Over the last few months, the patient has noticed a progressive worsening in the discomfort along her varicosities.  The patient also notes experiencing bilateral lower extremity edema.  The patient's painful varicosities and edema have progressed to the point that she is unable to function on a daily basis.  She considers her symptoms lifestyle limiting.  The patient denies any recent trauma or surgery to the bilateral lower extremity.  The patient denies any DVT history to the bilateral lower extremity.  The patient denies any fever, nausea vomiting.  The patient denies any claudication-like symptoms, rest pain or ulceration to the bilateral lower extremity.  1. Pain in both lower extremities - New Patient with 2+ palpable pulses to the bilateral lower extremity. I do not feel the patient's discomfort is arterial in origin  2. Bilateral lower extremity edema - New As below  3. Varicose veins of both lower extremities with pain - New The patient is to continue wearing medical  grade 1 compression stockings to the bilateral lower extremity in a daily basis The patient is to continue elevating her legs heart level or higher The patient is to continue remaining active on a daily basis as well We will bring the patient back in 1 month to undergo bilateral venous duplex to rule out any new/worsening venous reflux Depending on the results of the venous duplex she may be a candidate for laser ablation, sclerotherapy or lymphedema pump Information on chronic venous insufficiency and compression stockings was given to the patient. The patient was instructed to call the office in the interim if any worsening edema or ulcerations to the legs, feet or toes occurs. The patient expresses their understanding.  - VAS [place] LOWER EXTREMITY VENOUS REFLUX; Future  Current Outpatient Medications on File Prior to Visit  Medication Sig Dispense Refill  . Calcium Carb-Cholecalciferol (  CALCIUM-VITAMIN D) 500-200 MG-UNIT tablet Take by mouth.    . clindamycin (CLEOCIN T) 1 % lotion APPLY IN THE MORNING  1  . esomeprazole (NEXIUM) 40 MG capsule 1 capsule once daily.    . fluticasone (FLONASE) 50 MCG/ACT nasal spray SPRAY 2 SPRAYS INTO EACH NOSTRIL EVERY DAY    . ibuprofen (ADVIL,MOTRIN) 200 MG tablet Take by mouth.    Marland Kitchen Linaclotide (LINZESS) 145 MCG CAPS capsule Take by mouth.    . meloxicam (MOBIC) 7.5 MG tablet Take 7.5 mg by mouth daily.    . Multiple Vitamin (MULTI-VITAMINS) TABS Take by mouth.    . Olopatadine HCl 0.6 % SOLN INSTILL 2 SPRAYS INTO EACH NOSTRIL 2 TIMES A DAY  3  . Omega-3 Fatty Acids (FISH OIL) 1000 MG CAPS Take by mouth.    . phentermine (ADIPEX-P) 37.5 MG tablet Take 37.5 mg by mouth.    . Polyethylene Glycol 3350 (PEG 3350) POWD TAKE 17 G BY MOUTH ONCE DAILY. MIX IN 4 TO 8 OUNCES OF FLUID PRIOR TO TAKING    . clindamycin (CLEOCIN T) 1 % lotion Apply in am     No current facility-administered medications on file prior to visit.    There are no Patient Instructions on  file for this visit. No Follow-up on file.  Mayjor Ager A Julianne Chamberlin, PA-C

## 2017-06-25 DIAGNOSIS — K080 Exfoliation of teeth due to systemic causes: Secondary | ICD-10-CM | POA: Diagnosis not present

## 2017-07-21 ENCOUNTER — Encounter (INDEPENDENT_AMBULATORY_CARE_PROVIDER_SITE_OTHER): Payer: Self-pay | Admitting: Vascular Surgery

## 2017-07-21 ENCOUNTER — Ambulatory Visit (INDEPENDENT_AMBULATORY_CARE_PROVIDER_SITE_OTHER): Payer: Federal, State, Local not specified - PPO | Admitting: Vascular Surgery

## 2017-07-21 ENCOUNTER — Ambulatory Visit (INDEPENDENT_AMBULATORY_CARE_PROVIDER_SITE_OTHER): Payer: Federal, State, Local not specified - PPO

## 2017-07-21 VITALS — BP 140/89 | HR 75 | Resp 16 | Ht 61.0 in | Wt 161.0 lb

## 2017-07-21 DIAGNOSIS — R600 Localized edema: Secondary | ICD-10-CM | POA: Diagnosis not present

## 2017-07-21 DIAGNOSIS — I83813 Varicose veins of bilateral lower extremities with pain: Secondary | ICD-10-CM

## 2017-07-21 DIAGNOSIS — I890 Lymphedema, not elsewhere classified: Secondary | ICD-10-CM | POA: Insufficient documentation

## 2017-07-21 NOTE — Assessment & Plan Note (Signed)
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  Stage I.  Her venous duplex today was unrevealing in the left lower extremity with no significant reflux or DVT identified.  On the right lower extremity, there was only reflux in the femoral vein and at the saphenofemoral junction with no DVT or superficial thrombophlebitis. At this point, it appears as if the patient has developed some lymphedema from chronic scarring of the lymphatic channels.  No further venous disease requiring treatment is found.  I believe she would benefit from a lymphedema pump.  I believe she should continue to wear her compression stockings daily, elevate her legs, and remain active.  I will plan on seeing her back in 6 months for follow-up to assess her response to the lymphedema pump and her other conventional therapies.  She will contact our office with any problems in the interim.

## 2017-07-21 NOTE — Progress Notes (Signed)
MRN : [id number]  Valerie Braun is a 52 y.o. (1966-01-16) female who presents with chief complaint of  Chief Complaint  Patient presents with  . Follow-up    bil ven reflux ultrasound  .  History of Present Illness: Patient returns today in follow up of left leg swelling.  Both legs actually have some swelling although the left is more significantly affected.  She denies any fevers or chills.  No chest pain or shortness of breath.  Her venous duplex today was unrevealing in the left lower extremity with no significant reflux or DVT identified.  On the right lower extremity, there was only reflux in the femoral vein and at the saphenofemoral junction with no DVT or superficial thrombophlebitis.  Current Outpatient Medications  Medication Sig Dispense Refill  . Calcium Carb-Cholecalciferol (CALCIUM-VITAMIN D) 500-200 MG-UNIT tablet Take by mouth.    . clindamycin (CLEOCIN T) 1 % lotion Apply in am    . clindamycin (CLEOCIN T) 1 % lotion APPLY IN THE MORNING  1  . esomeprazole (NEXIUM) 40 MG capsule 1 capsule once daily.    . fluticasone (FLONASE) 50 MCG/ACT nasal spray SPRAY 2 SPRAYS INTO EACH NOSTRIL EVERY DAY    . ibuprofen (ADVIL,MOTRIN) 200 MG tablet Take by mouth.    Marland Kitchen Linaclotide (LINZESS) 145 MCG CAPS capsule Take by mouth.    . meloxicam (MOBIC) 7.5 MG tablet Take 7.5 mg by mouth daily.    . Multiple Vitamin (MULTI-VITAMINS) TABS Take by mouth.    . Olopatadine HCl 0.6 % SOLN INSTILL 2 SPRAYS INTO EACH NOSTRIL 2 TIMES A DAY  3  . Omega-3 Fatty Acids (FISH OIL) 1000 MG CAPS Take by mouth.    . phentermine (ADIPEX-P) 37.5 MG tablet Take 37.5 mg by mouth.    . Polyethylene Glycol 3350 (PEG 3350) POWD TAKE 17 G BY MOUTH ONCE DAILY. MIX IN 4 TO 8 OUNCES OF FLUID PRIOR TO TAKING     No current facility-administered medications for this visit.     Past Medical History:  Diagnosis Date  . Acute cystitis 12/27/2014  . 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 Avitaminosis D 08/25/2013  .  Fatigue 08/25/2013  . Leg pain 08/25/2013    Past Surgical History:  Procedure Laterality Date  . TUBAL LIGATION      Social History Social History   Tobacco Use  . Smoking status: Never Smoker  . Smokeless tobacco: Never Used  Substance Use Topics  . Alcohol use: No    Alcohol/week: 0.0 oz  . Drug use: No    Family History Family History  Adopted: Yes  Problem Relation Age of Onset  . Prostate cancer Maternal Uncle   . Tuberculosis Maternal Grandfather     Allergies  Allergen Reactions  . Other Itching    Red Kool Aid     REVIEW OF SYSTEMS (Negative unless checked)  Constitutional: [] Weight loss  [] Fever  [] Chills Cardiac: [] Chest pain   [] Chest pressure   [] Palpitations   [] Shortness of breath when laying flat   [] Shortness of breath at rest   [] Shortness of breath with exertion. Vascular:  [] Pain in legs with walking   [] Pain in legs at rest   [] Pain in legs when laying flat   [] Claudication   [] Pain in feet when walking  [] Pain in feet at rest  [] Pain in feet when laying flat   [] History of DVT   [] Phlebitis   [x] Swelling in legs   [x] Varicose veins   [] Non-healing  ulcers Pulmonary:   [] Uses home oxygen   [] Productive cough   [] Hemoptysis   [] Wheeze  [] COPD   [] Asthma Neurologic:  [] Dizziness  [] Blackouts   [] Seizures   [] History of stroke   [] History of TIA  [] Aphasia   [] Temporary blindness   [] Dysphagia   [] Weakness or numbness in arms   [] Weakness or numbness in legs Musculoskeletal:  [] Arthritis   [] Joint swelling   [] Joint pain   [] Low back pain Hematologic:  [] Easy bruising  [] Easy bleeding   [] Hypercoagulable state   [] Anemic   Gastrointestinal:  [] Blood in stool   [] Vomiting blood  [] Gastroesophageal reflux/heartburn   [] Abdominal pain Genitourinary:  [] Chronic kidney disease   [] Difficult urination  [] Frequent urination  [] Burning with urination   [] Hematuria Skin:  [] Rashes   [] Ulcers   [] Wounds Psychological:  [] History of anxiety   []  History of major  depression.  Physical Examination  BP 140/89 (BP Location: Right Arm)   Pulse 75   Resp 16   Ht 5\' 1"  (1.549 m)   Wt 73 kg (161 lb)   BMI 30.42 kg/m  Gen:  WD/WN, NAD.  Appears younger than stated age Head: Ages/AT, No temporalis wasting. Ear/Nose/Throat: Hearing grossly intact, nares w/o erythema or drainage Eyes: Conjunctiva clear. Sclera non-icteric Neck: Supple.  Trachea midline Pulmonary:  Good air movement, no use of accessory muscles.  Cardiac: RRR, no JVD Vascular:  Vessel Right Left  Radial Palpable Palpable                          PT Palpable Palpable  DP Palpable Palpable    Musculoskeletal: M/S 5/5 throughout.  No deformity or atrophy.  Trace left lower extremity edema. Neurologic: Sensation grossly intact in extremities.  Symmetrical.  Speech is fluent.  Psychiatric: Judgment intact, Mood & affect appropriate for pt's clinical situation. Dermatologic: No rashes or ulcers noted.  No cellulitis or open wounds.       Labs No results found for this or any previous visit (from the past 2160 hour(s)).  Radiology No results found.   Assessment/Plan  Varicose veins of both lower extremities with pain Has undergone previous treatments.  Venous reflux study today was really fairly unrevealing.  No further venous intervention planned at this time.  Bilateral lower extremity edema Her venous duplex today was unrevealing in the left lower extremity with no significant reflux or DVT identified.  On the right lower extremity, there was only reflux in the femoral vein and at the saphenofemoral junction with no DVT or superficial thrombophlebitis. At this point, it appears as if the patient has developed some lymphedema from chronic scarring of the lymphatic channels.  No further venous disease requiring treatment is found.  I believe she would benefit from a lymphedema pump.  I believe she should continue to wear her compression stockings daily, elevate her legs,  and remain active.  I will plan on seeing her back in 6 months for follow-up to assess her response to the lymphedema pump and her other conventional therapies.  She will contact our office with any problems in the interim.  Lymphedema Likely secondary lymphedema from previous venous disease.  Stage I.  Her venous duplex today was unrevealing in the left lower extremity with no significant reflux or DVT identified.  On the right lower extremity, there was only reflux in the femoral vein and at the saphenofemoral junction with no DVT or superficial thrombophlebitis. At this point, it appears as  if the patient has developed some lymphedema from chronic scarring of the lymphatic channels.  No further venous disease requiring treatment is found.  I believe she would benefit from a lymphedema pump.  I believe she should continue to wear her compression stockings daily, elevate her legs, and remain active.  I will plan on seeing her back in 6 months for follow-up to assess her response to the lymphedema pump and her other conventional therapies.  She will contact our office with any problems in the interim.    Leotis Pain, MD  07/21/2017 5:05 PM    This note was created with Dragon medical transcription system.  Any errors from dictation are purely unintentional

## 2017-07-21 NOTE — Patient Instructions (Signed)

## 2017-07-21 NOTE — Assessment & Plan Note (Signed)
Has undergone previous treatments.  Venous reflux study today was really fairly unrevealing.  No further venous intervention planned at this time.

## 2017-07-21 NOTE — Assessment & Plan Note (Signed)
Her venous duplex today was unrevealing in the left lower extremity with no significant reflux or DVT identified.  On the right lower extremity, there was only reflux in the femoral vein and at the saphenofemoral junction with no DVT or superficial thrombophlebitis. At this point, it appears as if the patient has developed some lymphedema from chronic scarring of the lymphatic channels.  No further venous disease requiring treatment is found.  I believe she would benefit from a lymphedema pump.  I believe she should continue to wear her compression stockings daily, elevate her legs, and remain active.  I will plan on seeing her back in 6 months for follow-up to assess her response to the lymphedema pump and her other conventional therapies.  She will contact our office with any problems in the interim.

## 2017-09-02 DIAGNOSIS — H40003 Preglaucoma, unspecified, bilateral: Secondary | ICD-10-CM | POA: Diagnosis not present

## 2017-10-12 DIAGNOSIS — E669 Obesity, unspecified: Secondary | ICD-10-CM | POA: Diagnosis not present

## 2017-10-12 DIAGNOSIS — Z0000 Encounter for general adult medical examination without abnormal findings: Secondary | ICD-10-CM | POA: Diagnosis not present

## 2017-10-16 DIAGNOSIS — M8580 Other specified disorders of bone density and structure, unspecified site: Secondary | ICD-10-CM | POA: Diagnosis not present

## 2017-10-16 DIAGNOSIS — M1611 Unilateral primary osteoarthritis, right hip: Secondary | ICD-10-CM | POA: Diagnosis not present

## 2017-10-16 DIAGNOSIS — K581 Irritable bowel syndrome with constipation: Secondary | ICD-10-CM | POA: Diagnosis not present

## 2017-10-16 DIAGNOSIS — J3089 Other allergic rhinitis: Secondary | ICD-10-CM | POA: Diagnosis not present

## 2017-10-16 DIAGNOSIS — Z0000 Encounter for general adult medical examination without abnormal findings: Secondary | ICD-10-CM | POA: Diagnosis not present

## 2017-11-20 DIAGNOSIS — G5601 Carpal tunnel syndrome, right upper limb: Secondary | ICD-10-CM | POA: Diagnosis not present

## 2017-11-20 DIAGNOSIS — M7581 Other shoulder lesions, right shoulder: Secondary | ICD-10-CM | POA: Diagnosis not present

## 2017-12-15 DIAGNOSIS — M62830 Muscle spasm of back: Secondary | ICD-10-CM | POA: Diagnosis not present

## 2017-12-15 DIAGNOSIS — G5601 Carpal tunnel syndrome, right upper limb: Secondary | ICD-10-CM | POA: Diagnosis not present

## 2017-12-15 DIAGNOSIS — M9907 Segmental and somatic dysfunction of upper extremity: Secondary | ICD-10-CM | POA: Diagnosis not present

## 2017-12-15 DIAGNOSIS — M4608 Spinal enthesopathy, sacral and sacrococcygeal region: Secondary | ICD-10-CM | POA: Diagnosis not present

## 2017-12-15 DIAGNOSIS — M9904 Segmental and somatic dysfunction of sacral region: Secondary | ICD-10-CM | POA: Diagnosis not present

## 2017-12-16 DIAGNOSIS — M62830 Muscle spasm of back: Secondary | ICD-10-CM | POA: Diagnosis not present

## 2017-12-16 DIAGNOSIS — M9907 Segmental and somatic dysfunction of upper extremity: Secondary | ICD-10-CM | POA: Diagnosis not present

## 2017-12-16 DIAGNOSIS — G5601 Carpal tunnel syndrome, right upper limb: Secondary | ICD-10-CM | POA: Diagnosis not present

## 2017-12-16 DIAGNOSIS — M4608 Spinal enthesopathy, sacral and sacrococcygeal region: Secondary | ICD-10-CM | POA: Diagnosis not present

## 2017-12-16 DIAGNOSIS — M9904 Segmental and somatic dysfunction of sacral region: Secondary | ICD-10-CM | POA: Diagnosis not present

## 2017-12-17 DIAGNOSIS — Z01419 Encounter for gynecological examination (general) (routine) without abnormal findings: Secondary | ICD-10-CM | POA: Diagnosis not present

## 2017-12-17 DIAGNOSIS — Z1231 Encounter for screening mammogram for malignant neoplasm of breast: Secondary | ICD-10-CM | POA: Diagnosis not present

## 2017-12-22 DIAGNOSIS — M62830 Muscle spasm of back: Secondary | ICD-10-CM | POA: Diagnosis not present

## 2017-12-22 DIAGNOSIS — M9904 Segmental and somatic dysfunction of sacral region: Secondary | ICD-10-CM | POA: Diagnosis not present

## 2017-12-22 DIAGNOSIS — M9907 Segmental and somatic dysfunction of upper extremity: Secondary | ICD-10-CM | POA: Diagnosis not present

## 2017-12-22 DIAGNOSIS — G5601 Carpal tunnel syndrome, right upper limb: Secondary | ICD-10-CM | POA: Diagnosis not present

## 2017-12-22 DIAGNOSIS — M4608 Spinal enthesopathy, sacral and sacrococcygeal region: Secondary | ICD-10-CM | POA: Diagnosis not present

## 2017-12-24 DIAGNOSIS — M62830 Muscle spasm of back: Secondary | ICD-10-CM | POA: Diagnosis not present

## 2017-12-24 DIAGNOSIS — G5601 Carpal tunnel syndrome, right upper limb: Secondary | ICD-10-CM | POA: Diagnosis not present

## 2017-12-24 DIAGNOSIS — M9907 Segmental and somatic dysfunction of upper extremity: Secondary | ICD-10-CM | POA: Diagnosis not present

## 2017-12-24 DIAGNOSIS — M9904 Segmental and somatic dysfunction of sacral region: Secondary | ICD-10-CM | POA: Diagnosis not present

## 2017-12-24 DIAGNOSIS — M4608 Spinal enthesopathy, sacral and sacrococcygeal region: Secondary | ICD-10-CM | POA: Diagnosis not present

## 2017-12-25 DIAGNOSIS — M9907 Segmental and somatic dysfunction of upper extremity: Secondary | ICD-10-CM | POA: Diagnosis not present

## 2017-12-25 DIAGNOSIS — M9904 Segmental and somatic dysfunction of sacral region: Secondary | ICD-10-CM | POA: Diagnosis not present

## 2017-12-25 DIAGNOSIS — M62830 Muscle spasm of back: Secondary | ICD-10-CM | POA: Diagnosis not present

## 2017-12-25 DIAGNOSIS — M4608 Spinal enthesopathy, sacral and sacrococcygeal region: Secondary | ICD-10-CM | POA: Diagnosis not present

## 2017-12-25 DIAGNOSIS — G5601 Carpal tunnel syndrome, right upper limb: Secondary | ICD-10-CM | POA: Diagnosis not present

## 2017-12-28 DIAGNOSIS — M9907 Segmental and somatic dysfunction of upper extremity: Secondary | ICD-10-CM | POA: Diagnosis not present

## 2017-12-28 DIAGNOSIS — M4608 Spinal enthesopathy, sacral and sacrococcygeal region: Secondary | ICD-10-CM | POA: Diagnosis not present

## 2017-12-28 DIAGNOSIS — M62830 Muscle spasm of back: Secondary | ICD-10-CM | POA: Diagnosis not present

## 2017-12-28 DIAGNOSIS — M9904 Segmental and somatic dysfunction of sacral region: Secondary | ICD-10-CM | POA: Diagnosis not present

## 2017-12-28 DIAGNOSIS — G5601 Carpal tunnel syndrome, right upper limb: Secondary | ICD-10-CM | POA: Diagnosis not present

## 2017-12-31 DIAGNOSIS — M4608 Spinal enthesopathy, sacral and sacrococcygeal region: Secondary | ICD-10-CM | POA: Diagnosis not present

## 2017-12-31 DIAGNOSIS — G5601 Carpal tunnel syndrome, right upper limb: Secondary | ICD-10-CM | POA: Diagnosis not present

## 2017-12-31 DIAGNOSIS — M9904 Segmental and somatic dysfunction of sacral region: Secondary | ICD-10-CM | POA: Diagnosis not present

## 2017-12-31 DIAGNOSIS — M62830 Muscle spasm of back: Secondary | ICD-10-CM | POA: Diagnosis not present

## 2017-12-31 DIAGNOSIS — M9907 Segmental and somatic dysfunction of upper extremity: Secondary | ICD-10-CM | POA: Diagnosis not present

## 2018-01-04 DIAGNOSIS — M9907 Segmental and somatic dysfunction of upper extremity: Secondary | ICD-10-CM | POA: Diagnosis not present

## 2018-01-04 DIAGNOSIS — G5601 Carpal tunnel syndrome, right upper limb: Secondary | ICD-10-CM | POA: Diagnosis not present

## 2018-01-04 DIAGNOSIS — M62830 Muscle spasm of back: Secondary | ICD-10-CM | POA: Diagnosis not present

## 2018-01-04 DIAGNOSIS — M4608 Spinal enthesopathy, sacral and sacrococcygeal region: Secondary | ICD-10-CM | POA: Diagnosis not present

## 2018-01-04 DIAGNOSIS — M9904 Segmental and somatic dysfunction of sacral region: Secondary | ICD-10-CM | POA: Diagnosis not present

## 2018-01-07 DIAGNOSIS — M62830 Muscle spasm of back: Secondary | ICD-10-CM | POA: Diagnosis not present

## 2018-01-07 DIAGNOSIS — M9904 Segmental and somatic dysfunction of sacral region: Secondary | ICD-10-CM | POA: Diagnosis not present

## 2018-01-07 DIAGNOSIS — M9907 Segmental and somatic dysfunction of upper extremity: Secondary | ICD-10-CM | POA: Diagnosis not present

## 2018-01-07 DIAGNOSIS — G5601 Carpal tunnel syndrome, right upper limb: Secondary | ICD-10-CM | POA: Diagnosis not present

## 2018-01-07 DIAGNOSIS — M4608 Spinal enthesopathy, sacral and sacrococcygeal region: Secondary | ICD-10-CM | POA: Diagnosis not present

## 2018-01-11 DIAGNOSIS — M9907 Segmental and somatic dysfunction of upper extremity: Secondary | ICD-10-CM | POA: Diagnosis not present

## 2018-01-11 DIAGNOSIS — M9904 Segmental and somatic dysfunction of sacral region: Secondary | ICD-10-CM | POA: Diagnosis not present

## 2018-01-11 DIAGNOSIS — G5601 Carpal tunnel syndrome, right upper limb: Secondary | ICD-10-CM | POA: Diagnosis not present

## 2018-01-11 DIAGNOSIS — M4608 Spinal enthesopathy, sacral and sacrococcygeal region: Secondary | ICD-10-CM | POA: Diagnosis not present

## 2018-01-12 DIAGNOSIS — M4608 Spinal enthesopathy, sacral and sacrococcygeal region: Secondary | ICD-10-CM | POA: Diagnosis not present

## 2018-01-12 DIAGNOSIS — M9907 Segmental and somatic dysfunction of upper extremity: Secondary | ICD-10-CM | POA: Diagnosis not present

## 2018-01-12 DIAGNOSIS — M9904 Segmental and somatic dysfunction of sacral region: Secondary | ICD-10-CM | POA: Diagnosis not present

## 2018-01-12 DIAGNOSIS — G5601 Carpal tunnel syndrome, right upper limb: Secondary | ICD-10-CM | POA: Diagnosis not present

## 2018-01-19 DIAGNOSIS — M4608 Spinal enthesopathy, sacral and sacrococcygeal region: Secondary | ICD-10-CM | POA: Diagnosis not present

## 2018-01-19 DIAGNOSIS — M9904 Segmental and somatic dysfunction of sacral region: Secondary | ICD-10-CM | POA: Diagnosis not present

## 2018-01-19 DIAGNOSIS — M9907 Segmental and somatic dysfunction of upper extremity: Secondary | ICD-10-CM | POA: Diagnosis not present

## 2018-01-22 ENCOUNTER — Ambulatory Visit (INDEPENDENT_AMBULATORY_CARE_PROVIDER_SITE_OTHER): Payer: Federal, State, Local not specified - PPO | Admitting: Vascular Surgery

## 2018-01-26 DIAGNOSIS — M4608 Spinal enthesopathy, sacral and sacrococcygeal region: Secondary | ICD-10-CM | POA: Diagnosis not present

## 2018-01-26 DIAGNOSIS — M9907 Segmental and somatic dysfunction of upper extremity: Secondary | ICD-10-CM | POA: Diagnosis not present

## 2018-01-26 DIAGNOSIS — G5601 Carpal tunnel syndrome, right upper limb: Secondary | ICD-10-CM | POA: Diagnosis not present

## 2018-01-26 DIAGNOSIS — M9904 Segmental and somatic dysfunction of sacral region: Secondary | ICD-10-CM | POA: Diagnosis not present

## 2018-01-28 DIAGNOSIS — K080 Exfoliation of teeth due to systemic causes: Secondary | ICD-10-CM | POA: Diagnosis not present

## 2018-02-24 DIAGNOSIS — K5900 Constipation, unspecified: Secondary | ICD-10-CM | POA: Diagnosis not present

## 2018-02-24 DIAGNOSIS — Z1211 Encounter for screening for malignant neoplasm of colon: Secondary | ICD-10-CM | POA: Diagnosis not present

## 2018-02-24 DIAGNOSIS — K219 Gastro-esophageal reflux disease without esophagitis: Secondary | ICD-10-CM | POA: Diagnosis not present

## 2018-03-01 DIAGNOSIS — R857 Abnormal histological findings in specimens from digestive organs and abdominal cavity: Secondary | ICD-10-CM | POA: Diagnosis not present

## 2018-03-01 DIAGNOSIS — K222 Esophageal obstruction: Secondary | ICD-10-CM | POA: Diagnosis not present

## 2018-03-01 DIAGNOSIS — K219 Gastro-esophageal reflux disease without esophagitis: Secondary | ICD-10-CM | POA: Diagnosis not present

## 2018-03-15 ENCOUNTER — Other Ambulatory Visit: Payer: Self-pay | Admitting: Obstetrics and Gynecology

## 2018-03-15 DIAGNOSIS — Z1231 Encounter for screening mammogram for malignant neoplasm of breast: Secondary | ICD-10-CM

## 2018-03-22 DIAGNOSIS — Z0000 Encounter for general adult medical examination without abnormal findings: Secondary | ICD-10-CM | POA: Diagnosis not present

## 2018-03-24 ENCOUNTER — Ambulatory Visit
Admission: RE | Admit: 2018-03-24 | Discharge: 2018-03-24 | Disposition: A | Discharge status: Home / Self Care | Payer: Federal, State, Local not specified - PPO | Source: Ambulatory Visit | Attending: Obstetrics and Gynecology | Admitting: Obstetrics and Gynecology

## 2018-03-24 DIAGNOSIS — Z1231 Encounter for screening mammogram for malignant neoplasm of breast: Secondary | ICD-10-CM | POA: Diagnosis not present

## 2018-03-26 DIAGNOSIS — E663 Overweight: Secondary | ICD-10-CM | POA: Diagnosis not present

## 2018-03-26 DIAGNOSIS — M79672 Pain in left foot: Secondary | ICD-10-CM | POA: Diagnosis not present

## 2018-03-26 DIAGNOSIS — R7303 Prediabetes: Secondary | ICD-10-CM | POA: Diagnosis not present

## 2018-03-26 DIAGNOSIS — M79604 Pain in right leg: Secondary | ICD-10-CM | POA: Diagnosis not present

## 2018-04-15 DIAGNOSIS — M2141 Flat foot [pes planus] (acquired), right foot: Secondary | ICD-10-CM | POA: Diagnosis not present

## 2018-04-15 DIAGNOSIS — M2142 Flat foot [pes planus] (acquired), left foot: Secondary | ICD-10-CM | POA: Diagnosis not present

## 2018-04-15 DIAGNOSIS — M76822 Posterior tibial tendinitis, left leg: Secondary | ICD-10-CM | POA: Diagnosis not present

## 2018-04-30 DIAGNOSIS — K5909 Other constipation: Secondary | ICD-10-CM | POA: Diagnosis not present

## 2018-04-30 DIAGNOSIS — K219 Gastro-esophageal reflux disease without esophagitis: Secondary | ICD-10-CM | POA: Diagnosis not present

## 2018-05-26 DIAGNOSIS — M2141 Flat foot [pes planus] (acquired), right foot: Secondary | ICD-10-CM | POA: Diagnosis not present

## 2018-05-26 DIAGNOSIS — M2142 Flat foot [pes planus] (acquired), left foot: Secondary | ICD-10-CM | POA: Diagnosis not present

## 2018-05-26 DIAGNOSIS — M76822 Posterior tibial tendinitis, left leg: Secondary | ICD-10-CM | POA: Diagnosis not present

## 2018-05-27 DIAGNOSIS — J069 Acute upper respiratory infection, unspecified: Secondary | ICD-10-CM | POA: Diagnosis not present

## 2018-06-16 IMAGING — CT CT TEMPORAL BONES W/O CM
2 of 4 series · 14 of 40 positions shown, 17 images · non-contrast
Comparison: Cervical spine MRI 10/26/2015.

CLINICAL DATA: 51-year-old female with right side hearing loss and
ear ache for several months with no known injury. Conductive hearing
loss.

EXAM:
CT TEMPORAL BONES WITHOUT CONTRAST
TECHNIQUE: Axial and coronal plane CT imaging of the petrous temporal bones was
performed with thin-collimation image reconstruction. No intravenous
contrast was administered. Multiplanar CT image reconstructions were
also generated.

[Series 3: ax soft · axial · 0.30mm/px · z∈[-114,-56]mm · 12 of 35 slices shown, 15 images]
[im 3/35  brain]
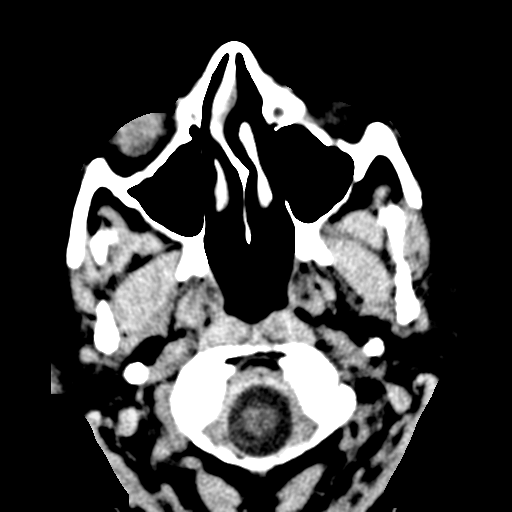
[im 3/35  bone]
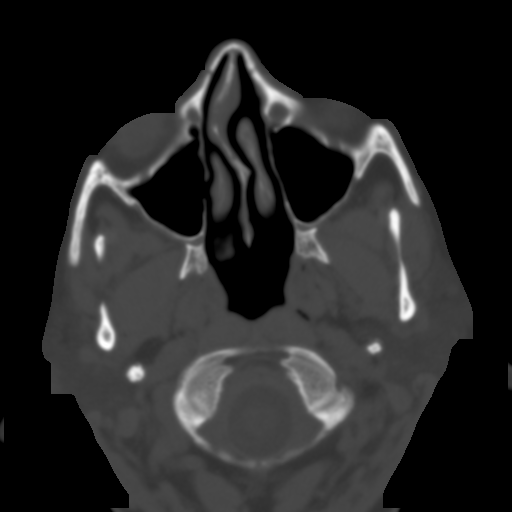
[im 5/35  bone]
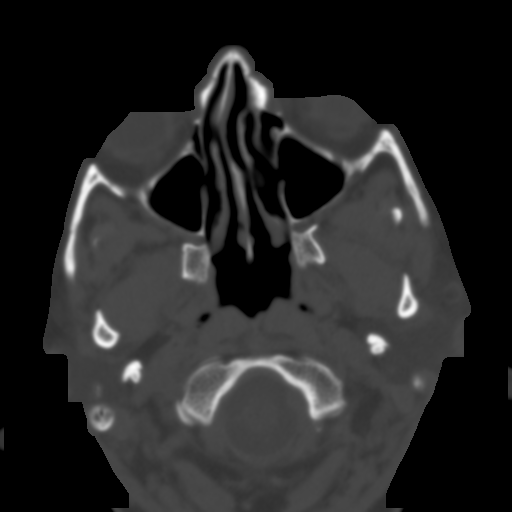
[im 8/35  bone]
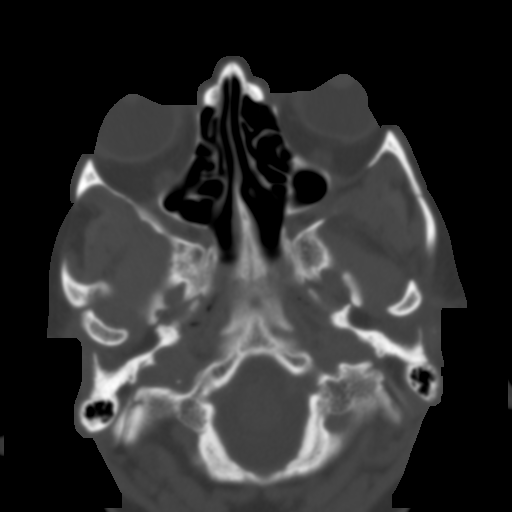
[im 11/35  bone]
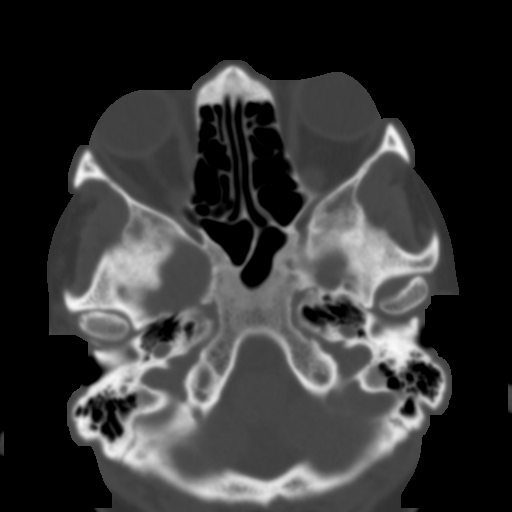
[im 13/35  brain]
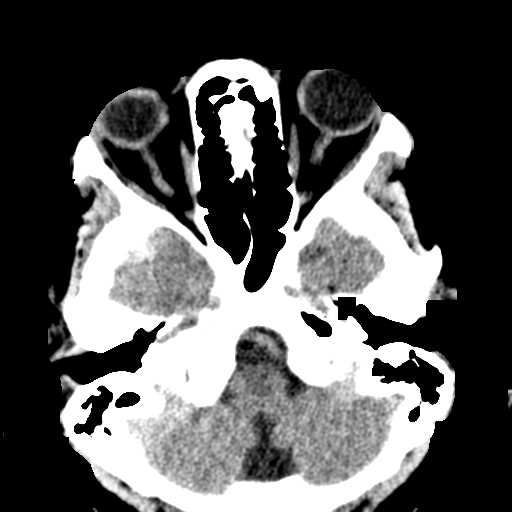
[im 13/35  bone]
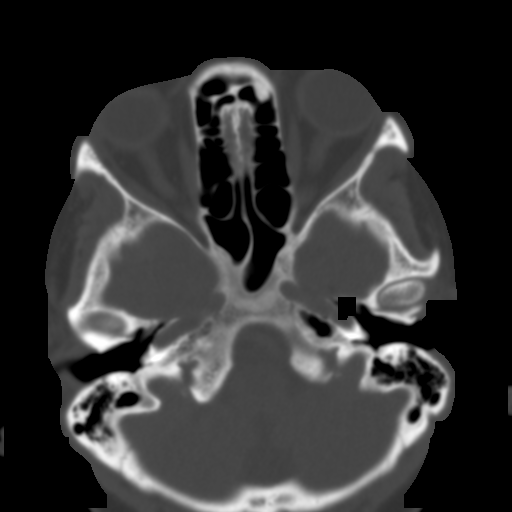
[im 16/35  bone]
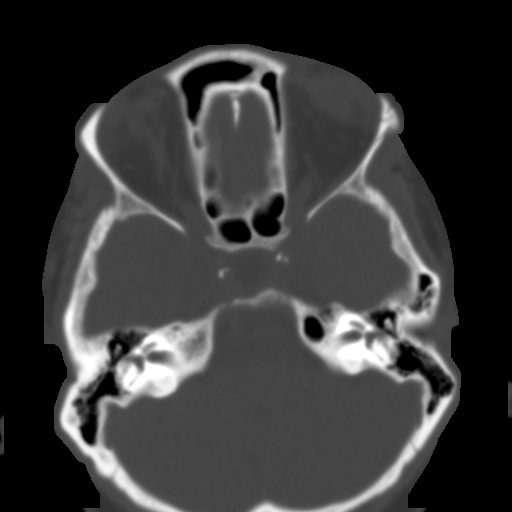
[im 19/35  bone]
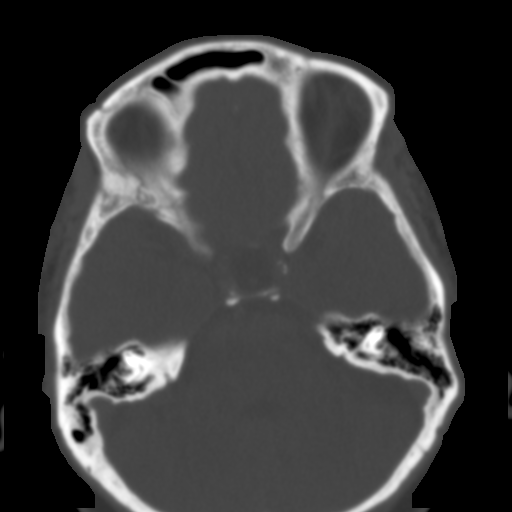
[im 22/35  bone]
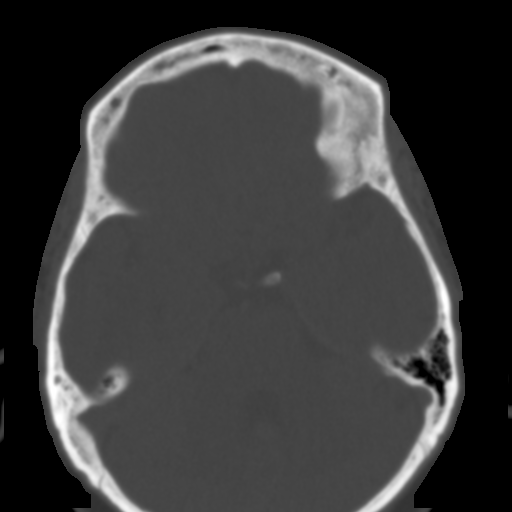
[im 24/35  brain]
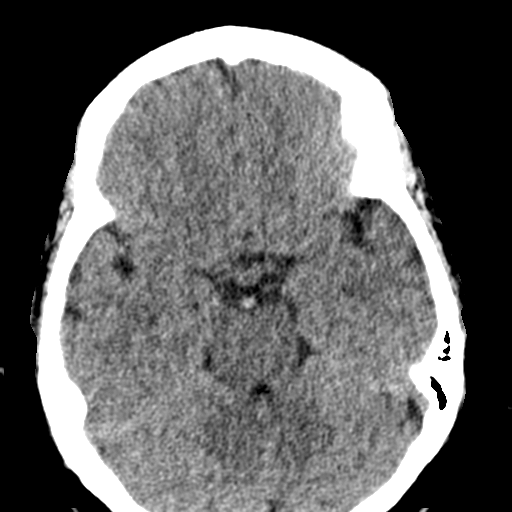
[im 24/35  bone]
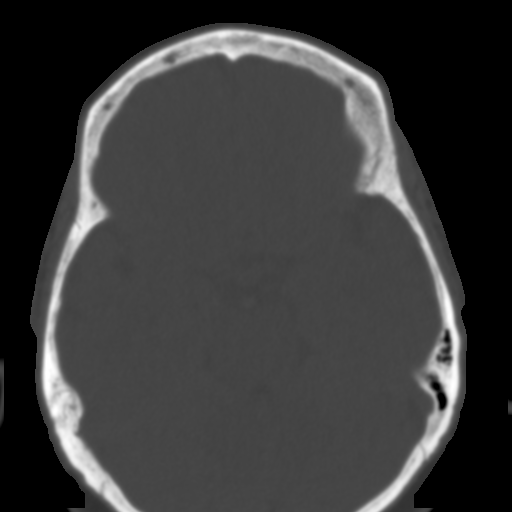
[im 27/35  bone]
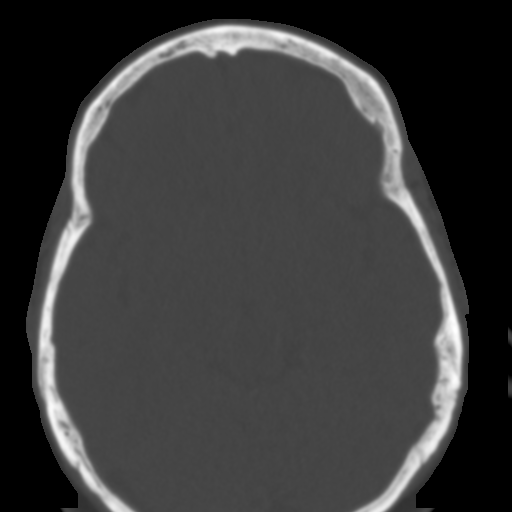
[im 30/35  bone]
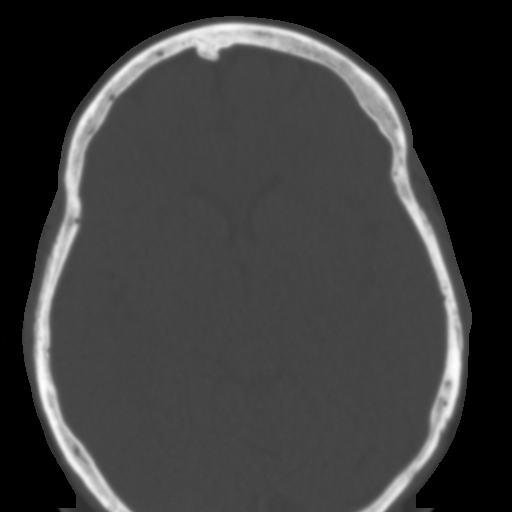
[im 32/35  bone]
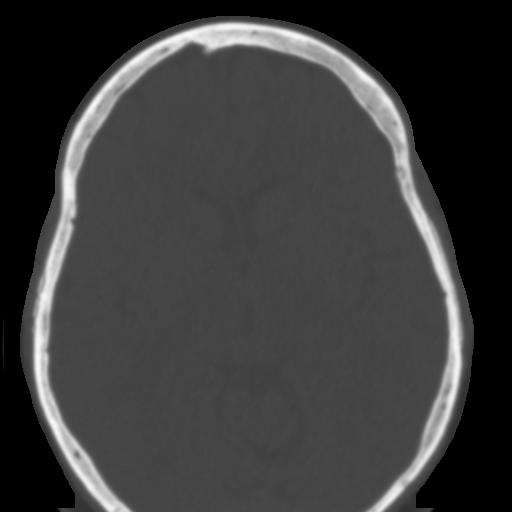

[Series 4: coronal bone. · coronal · 0.15mm/px · 2 of 217 slices shown]
[im 73/217  bone]
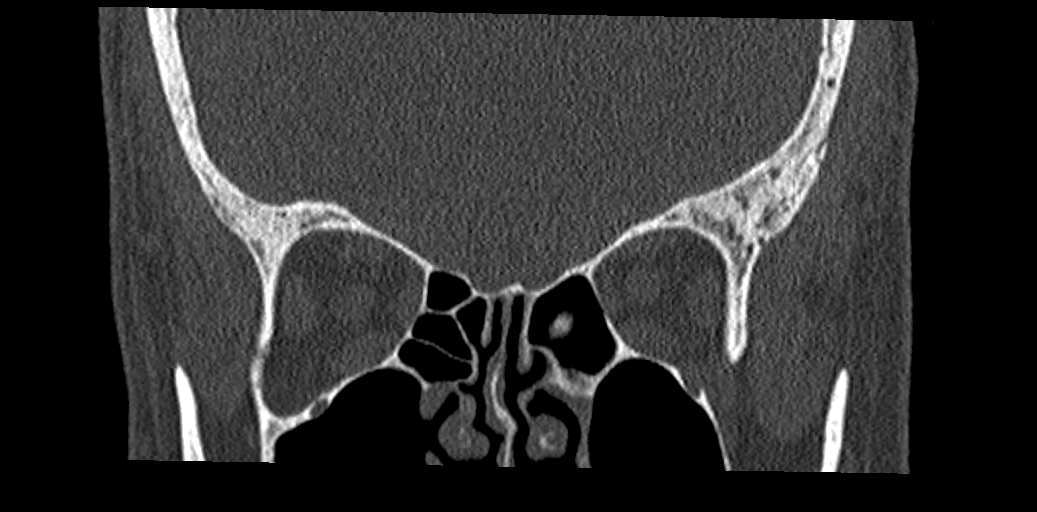
[im 145/217  bone]
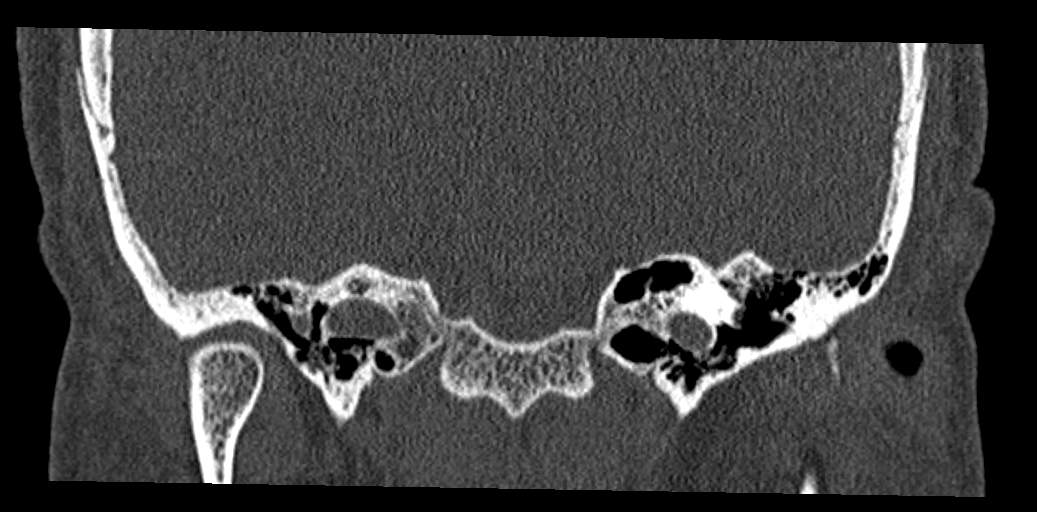

[14 of 40 positions shown; findings below may reference images not displayed]

FINDINGS: Calcified atherosclerosis at the skull base. Visualized noncontrast
brain parenchyma negative aside from partially empty sella. Dominant
appearing distal left vertebral artery.

Visualized orbits and scalp soft tissues are within normal limits.
Negative visualized noncontrast deep soft tissue spaces of the face.
Visible paranasal sinuses are clear. Overall normal bone
mineralization at the skullbase.

Left temporal bone:

Normal left EAC. Normal left tympanic membrane. The left tympanic
cavity is clear. The ossicles appear intact and normally aligned.
The scutum is intact. The left mastoid air cells are clear. Left
IAC, cochlea, vestibule and vestibular aqueduct are normal. Normal
left semicircular canals and course of the left seventh nerve.

Right temporal bone:

Normal EAC. The right tympanic cavity is clear. The right ossicles
appear intact, normally aligned, and symmetric to those on the left.
The right mastoids are clear. The right scutum is intact. The right
IAC and cochlea appear normal. Bone mineralization a row on the otic
capsule appears normal. The vestibule, vestibular aqueduct, and
semicircular canals are within normal limits. Normal course of the
right seventh nerve.
IMPRESSION: The right and left temporal bones appear symmetric and normal. No
explanation for conductive hearing loss.

## 2018-10-11 DIAGNOSIS — Z0000 Encounter for general adult medical examination without abnormal findings: Secondary | ICD-10-CM | POA: Diagnosis not present

## 2018-10-11 DIAGNOSIS — R7303 Prediabetes: Secondary | ICD-10-CM | POA: Diagnosis not present

## 2018-10-11 DIAGNOSIS — Z1389 Encounter for screening for other disorder: Secondary | ICD-10-CM | POA: Diagnosis not present

## 2018-10-18 DIAGNOSIS — Z0000 Encounter for general adult medical examination without abnormal findings: Secondary | ICD-10-CM | POA: Diagnosis not present

## 2018-10-18 DIAGNOSIS — M8580 Other specified disorders of bone density and structure, unspecified site: Secondary | ICD-10-CM | POA: Diagnosis not present

## 2018-10-18 DIAGNOSIS — E559 Vitamin D deficiency, unspecified: Secondary | ICD-10-CM | POA: Diagnosis not present

## 2018-12-17 DIAGNOSIS — H40003 Preglaucoma, unspecified, bilateral: Secondary | ICD-10-CM | POA: Diagnosis not present

## 2018-12-24 ENCOUNTER — Other Ambulatory Visit: Payer: Self-pay | Admitting: Physician Assistant

## 2018-12-24 DIAGNOSIS — Z124 Encounter for screening for malignant neoplasm of cervix: Secondary | ICD-10-CM | POA: Diagnosis not present

## 2018-12-24 DIAGNOSIS — Z1231 Encounter for screening mammogram for malignant neoplasm of breast: Secondary | ICD-10-CM

## 2018-12-24 DIAGNOSIS — E559 Vitamin D deficiency, unspecified: Secondary | ICD-10-CM | POA: Diagnosis not present

## 2018-12-24 DIAGNOSIS — R7303 Prediabetes: Secondary | ICD-10-CM | POA: Diagnosis not present

## 2018-12-24 DIAGNOSIS — Z0000 Encounter for general adult medical examination without abnormal findings: Secondary | ICD-10-CM | POA: Diagnosis not present

## 2018-12-24 DIAGNOSIS — M8580 Other specified disorders of bone density and structure, unspecified site: Secondary | ICD-10-CM | POA: Diagnosis not present

## 2018-12-24 DIAGNOSIS — M1611 Unilateral primary osteoarthritis, right hip: Secondary | ICD-10-CM | POA: Diagnosis not present

## 2019-02-25 DIAGNOSIS — K080 Exfoliation of teeth due to systemic causes: Secondary | ICD-10-CM | POA: Diagnosis not present

## 2019-04-12 DIAGNOSIS — Z20828 Contact with and (suspected) exposure to other viral communicable diseases: Secondary | ICD-10-CM | POA: Diagnosis not present

## 2019-04-18 ENCOUNTER — Ambulatory Visit
Admission: RE | Admit: 2019-04-18 | Discharge: 2019-04-18 | Disposition: A | Discharge status: Home / Self Care | Payer: Federal, State, Local not specified - PPO | Source: Ambulatory Visit | Attending: Physician Assistant | Admitting: Physician Assistant

## 2019-04-18 DIAGNOSIS — Z1231 Encounter for screening mammogram for malignant neoplasm of breast: Secondary | ICD-10-CM | POA: Diagnosis not present

## 2019-11-22 ENCOUNTER — Other Ambulatory Visit: Payer: Self-pay | Admitting: Student

## 2019-11-22 DIAGNOSIS — M25461 Effusion, right knee: Secondary | ICD-10-CM

## 2019-12-08 ENCOUNTER — Other Ambulatory Visit: Payer: Self-pay

## 2019-12-08 ENCOUNTER — Ambulatory Visit
Admission: RE | Admit: 2019-12-08 | Discharge: 2019-12-08 | Disposition: A | Discharge status: Home / Self Care | Payer: Federal, State, Local not specified - PPO | Source: Ambulatory Visit | Attending: Student | Admitting: Student

## 2019-12-08 DIAGNOSIS — M25461 Effusion, right knee: Secondary | ICD-10-CM | POA: Diagnosis not present

## 2020-01-09 ENCOUNTER — Other Ambulatory Visit: Payer: Self-pay

## 2020-01-09 ENCOUNTER — Emergency Department: Payer: Federal, State, Local not specified - PPO

## 2020-01-09 ENCOUNTER — Encounter: Payer: Self-pay | Admitting: Emergency Medicine

## 2020-01-09 ENCOUNTER — Inpatient Hospital Stay
Admit: 2020-01-09 | Discharge: 2020-01-09 | Disposition: A | Discharge status: Home / Self Care | Payer: Federal, State, Local not specified - PPO | Attending: Internal Medicine | Admitting: Internal Medicine

## 2020-01-09 ENCOUNTER — Ambulatory Visit: Payer: Self-pay | Admitting: Cardiovascular Disease

## 2020-01-09 ENCOUNTER — Inpatient Hospital Stay
Admission: EM | Admit: 2020-01-09 | Discharge: 2020-01-12 | DRG: 282 | Disposition: A | Discharge status: Home / Self Care | Payer: Federal, State, Local not specified - PPO | Attending: Internal Medicine | Admitting: Internal Medicine

## 2020-01-09 DIAGNOSIS — I214 Non-ST elevation (NSTEMI) myocardial infarction: Secondary | ICD-10-CM

## 2020-01-09 DIAGNOSIS — Z888 Allergy status to other drugs, medicaments and biological substances status: Secondary | ICD-10-CM | POA: Diagnosis not present

## 2020-01-09 DIAGNOSIS — Z809 Family history of malignant neoplasm, unspecified: Secondary | ICD-10-CM | POA: Diagnosis not present

## 2020-01-09 DIAGNOSIS — Z8249 Family history of ischemic heart disease and other diseases of the circulatory system: Secondary | ICD-10-CM

## 2020-01-09 DIAGNOSIS — Z79899 Other long term (current) drug therapy: Secondary | ICD-10-CM

## 2020-01-09 DIAGNOSIS — K219 Gastro-esophageal reflux disease without esophagitis: Secondary | ICD-10-CM | POA: Diagnosis present

## 2020-01-09 DIAGNOSIS — F1290 Cannabis use, unspecified, uncomplicated: Secondary | ICD-10-CM | POA: Diagnosis present

## 2020-01-09 DIAGNOSIS — Z20822 Contact with and (suspected) exposure to covid-19: Secondary | ICD-10-CM | POA: Diagnosis present

## 2020-01-09 DIAGNOSIS — E876 Hypokalemia: Secondary | ICD-10-CM

## 2020-01-09 DIAGNOSIS — I213 ST elevation (STEMI) myocardial infarction of unspecified site: Principal | ICD-10-CM | POA: Diagnosis present

## 2020-01-09 DIAGNOSIS — Z791 Long term (current) use of non-steroidal anti-inflammatories (NSAID): Secondary | ICD-10-CM | POA: Diagnosis not present

## 2020-01-09 DIAGNOSIS — K589 Irritable bowel syndrome without diarrhea: Secondary | ICD-10-CM | POA: Diagnosis present

## 2020-01-09 DIAGNOSIS — I2510 Atherosclerotic heart disease of native coronary artery without angina pectoris: Secondary | ICD-10-CM | POA: Diagnosis present

## 2020-01-09 DIAGNOSIS — Z6830 Body mass index (BMI) 30.0-30.9, adult: Secondary | ICD-10-CM

## 2020-01-09 DIAGNOSIS — R079 Chest pain, unspecified: Secondary | ICD-10-CM | POA: Diagnosis present

## 2020-01-09 DIAGNOSIS — E669 Obesity, unspecified: Secondary | ICD-10-CM | POA: Diagnosis present

## 2020-01-09 DIAGNOSIS — Z7982 Long term (current) use of aspirin: Secondary | ICD-10-CM | POA: Diagnosis not present

## 2020-01-09 HISTORY — DX: Non-ST elevation (NSTEMI) myocardial infarction: I21.4

## 2020-01-09 LAB — BASIC METABOLIC PANEL
Anion gap: 11 (ref 5–15)
BUN: 9 mg/dL (ref 6–20)
CO2: 27 mmol/L (ref 22–32)
Calcium: 9.4 mg/dL (ref 8.9–10.3)
Chloride: 106 mmol/L (ref 98–111)
Creatinine, Ser: 0.55 mg/dL (ref 0.44–1.00)
GFR calc Af Amer: >60 mL/min (ref >60)
GFR calc non Af Amer: >60 mL/min (ref >60)
Glucose, Bld: 141 mg/dL — ABNORMAL HIGH (ref 70–99)
Potassium: 3.2 mmol/L — ABNORMAL LOW (ref 3.5–5.1)
Sodium: 144 mmol/L (ref 135–145)

## 2020-01-09 LAB — TROPONIN I (HIGH SENSITIVITY)
Troponin I (High Sensitivity): 606 ng/L (ref <18)
Troponin I (High Sensitivity): 1904 ng/L (ref <18)

## 2020-01-09 LAB — CBC
HCT: 44.9 % (ref 36.0–46.0)
Hemoglobin: 15.6 g/dL — ABNORMAL HIGH (ref 12.0–15.0)
MCH: 31.6 pg (ref 26.0–34.0)
MCHC: 34.7 g/dL (ref 30.0–36.0)
MCV: 91.1 fL (ref 80.0–100.0)
Platelets: 312 10*3/uL (ref 150–400)
RBC: 4.93 MIL/uL (ref 3.87–5.11)
RDW: 13.4 % (ref 11.5–15.5)
WBC: 15 10*3/uL — ABNORMAL HIGH (ref 4.0–10.5)
nRBC: 0 % (ref 0.0–0.2)

## 2020-01-09 LAB — PROTIME-INR
INR: 0.9 (ref 0.8–1.2)
Prothrombin Time: 12.2 seconds (ref 11.4–15.2)

## 2020-01-09 LAB — APTT: aPTT: 31 seconds (ref 24–36)

## 2020-01-09 LAB — SARS CORONAVIRUS 2 BY RT PCR (HOSPITAL ORDER, PERFORMED IN ~~LOC~~ HOSPITAL LAB): SARS Coronavirus 2: NEGATIVE

## 2020-01-09 MED ORDER — ACETAMINOPHEN 325 MG PO TABS
650.0000 mg | ORAL_TABLET | ORAL | Status: DC | PRN
  Administered 2020-01-09: 650 mg via ORAL
  Filled 2020-01-09: qty 2

## 2020-01-09 MED ORDER — SODIUM CHLORIDE 0.9% FLUSH
3.0000 mL | Freq: Two times a day (BID) | INTRAVENOUS | Status: DC
  Administered 2020-01-09 – 2020-01-11 (×4): 3 mL via INTRAVENOUS

## 2020-01-09 MED ORDER — ONDANSETRON HCL 4 MG/2ML IJ SOLN: 4.0000 mg | Freq: Four times a day (QID) | INTRAMUSCULAR | Status: DC | PRN

## 2020-01-09 MED ORDER — POTASSIUM CHLORIDE CRYS ER 20 MEQ PO TBCR
40.0000 meq | EXTENDED_RELEASE_TABLET | Freq: Once | ORAL | Status: AC
  Administered 2020-01-09: 40 meq via ORAL
  Filled 2020-01-09: qty 2

## 2020-01-09 MED ORDER — ASPIRIN 81 MG PO CHEW
324.0000 mg | CHEWABLE_TABLET | Freq: Once | ORAL | Status: AC
  Administered 2020-01-09: 324 mg via ORAL
  Filled 2020-01-09: qty 4

## 2020-01-09 MED ORDER — ATORVASTATIN CALCIUM 80 MG PO TABS
80.0000 mg | ORAL_TABLET | Freq: Every day | ORAL | Status: DC
  Administered 2020-01-10 – 2020-01-11 (×2): 80 mg via ORAL
  Filled 2020-01-09 (×2): qty 1

## 2020-01-09 MED ORDER — NITROGLYCERIN 0.4 MG SL SUBL: 0.4000 mg | SUBLINGUAL_TABLET | SUBLINGUAL | Status: DC | PRN

## 2020-01-09 MED ORDER — TICAGRELOR 90 MG PO TABS
180.0000 mg | ORAL_TABLET | Freq: Once | ORAL | Status: AC
  Administered 2020-01-09: 180 mg via ORAL
  Filled 2020-01-09: qty 2

## 2020-01-09 MED ORDER — ASPIRIN EC 81 MG PO TBEC
81.0000 mg | DELAYED_RELEASE_TABLET | Freq: Every day | ORAL | Status: DC
  Administered 2020-01-10 – 2020-01-12 (×3): 81 mg via ORAL
  Filled 2020-01-09 (×3): qty 1

## 2020-01-09 MED ORDER — HEPARIN BOLUS VIA INFUSION
4000.0000 [IU] | Freq: Once | INTRAVENOUS | Status: AC
  Administered 2020-01-09: 4000 [IU] via INTRAVENOUS
  Filled 2020-01-09: qty 4000

## 2020-01-09 MED ORDER — NITROGLYCERIN 0.4 MG SL SUBL
0.4000 mg | SUBLINGUAL_TABLET | SUBLINGUAL | Status: AC | PRN
  Administered 2020-01-09 (×3): 0.4 mg via SUBLINGUAL
  Filled 2020-01-09 (×2): qty 1

## 2020-01-09 MED ORDER — HEPARIN (PORCINE) 25000 UT/250ML-% IV SOLN
850.0000 [IU]/h | INTRAVENOUS | Status: DC
  Administered 2020-01-09: 800 [IU]/h via INTRAVENOUS
  Administered 2020-01-10: 850 [IU]/h via INTRAVENOUS
  Filled 2020-01-09 (×2): qty 250

## 2020-01-09 NOTE — ED Provider Notes (Signed)
[facility] Emergency Department Provider Note   ____________________________________________   First MD Initiated Contact with Patient 01/09/20 1645     (approximate)  I have reviewed the triage vital signs and the nursing notes.   HISTORY  Chief Complaint Chest Pain    HPI Valerie Braun is a 54 y.o. female with past medical history of IBS and lymphedema who presents to the ED complaining of chest pain.  Patient reports that she has developed tight discomfort in the center of her chest over the past 2 to 3 days.  Pain was initially intermittent and worse with exertion, but became constant today.  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  She was also having some dyspnea on exertion earlier today, but currently denies any shortness of breath.  She has otherwise felt well with no fevers, cough, vomiting, diarrhea, pain or swelling in her legs.  She denies any significant cardiac history, did not take any medications for her pain prior to arrival.        Past Medical History:  Diagnosis Date  . Acute cystitis 12/27/2014  . 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 Avitaminosis D 08/25/2013  . Fatigue 08/25/2013  . Leg pain 08/25/2013    Patient Active Problem List   Diagnosis Date Noted  . NSTEMI (non-ST elevation myocardial infarction) ([place]) 01/09/2020  . Hypokalemia 01/09/2020  . Lymphedema 07/21/2017  . Pain in both lower extremities 06/12/2017  . Bilateral lower extremity edema 06/12/2017  . Varicose veins of both lower extremities with pain 06/12/2017  . Microscopic hematuria 02/06/2015  . 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 Osteopenia 01/30/2015  . 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 Acute cystitis 12/27/2014  . Fatigue 08/25/2013  . Leg pain 08/25/2013  . Avitaminosis D 08/25/2013    Past Surgical History:  Procedure Laterality Date  . TUBAL LIGATION      Prior to Admission  medications   Medication Sig Start Date End Date Taking? Authorizing Provider  Calcium Carb-Cholecalciferol (CALCIUM-VITAMIN D) 500-200 MG-UNIT tablet Take by mouth.    [provider]  clindamycin (CLEOCIN T) 1 % lotion Apply in am 05/12/17   [provider]  clindamycin (CLEOCIN T) 1 % lotion APPLY IN THE MORNING 05/12/17   [provider]  esomeprazole (NEXIUM) 40 MG capsule 1 capsule once daily. 08/30/14   [provider]  fluticasone (FLONASE) 50 MCG/ACT nasal spray SPRAY 2 SPRAYS INTO EACH NOSTRIL EVERY DAY 01/19/17   [provider]  ibuprofen (ADVIL,MOTRIN) 200 MG tablet Take by mouth.    [provider]  Linaclotide Rolan Lipa) 145 MCG CAPS capsule Take by mouth.    [provider]  meloxicam (MOBIC) 7.5 MG tablet Take 7.5 mg by mouth daily.    [provider]  Multiple Vitamin (MULTI-VITAMINS) TABS Take by mouth.    [provider]  Olopatadine HCl 0.6 % SOLN INSTILL 2 SPRAYS INTO EACH NOSTRIL 2 TIMES A DAY 12/06/14   [provider]  Omega-3 Fatty Acids (FISH OIL) 1000 MG CAPS Take by mouth.    [provider]  phentermine (ADIPEX-P) 37.5 MG tablet Take 37.5 mg by mouth. 01/08/17   [provider]  Polyethylene Glycol 3350 (PEG 3350) POWD TAKE 17 G BY MOUTH ONCE DAILY. MIX IN 4 TO 8 OUNCES OF FLUID PRIOR TO TAKING 01/06/17   [provider]    Allergies Other  Family History  Adopted: Yes  Problem Relation Age of Onset  . Prostate cancer Maternal Uncle   . Tuberculosis Maternal Grandfather   . Breast cancer Neg Hx     Social History Social History   Tobacco Use  . Smoking status: Never Smoker  . Smokeless tobacco: Never Used  Substance Use Topics  . Alcohol use: No    Alcohol/week: 0.0 standard drinks  . Drug use: No    Review of Systems  Constitutional: No fever/chills Eyes: No visual changes. ENT: No sore throat. Cardiovascular: Positive for chest  pain. Respiratory: Positive for shortness of breath. Gastrointestinal: No abdominal pain.  No nausea, no vomiting.  No diarrhea.  No constipation. Genitourinary: Negative for dysuria. Musculoskeletal: Negative for back pain. Skin: Negative for rash. Neurological: Negative for headaches, focal weakness or numbness.  ____________________________________________   PHYSICAL EXAM:  VITAL SIGNS: ED Triage Vitals  Enc Vitals Group     BP 01/09/20 1549 (!) 165/88     Pulse Rate 01/09/20 1548 82     Resp 01/09/20 1548 18     Temp 01/09/20 1548 99.4 F (37.4 C)     Temp Source 01/09/20 1548 Oral     SpO2 01/09/20 1548 99 %     Weight 01/09/20 1548 170 lb (77.1 kg)     Height 01/09/20 1548 5\' 3"  (1.6 m)     Head Circumference --      Peak Flow --      Pain Score 01/09/20 1548 10     Pain Loc --      Pain Edu? --      Excl. in [place]? --     Constitutional: Alert and oriented. Eyes: Conjunctivae are normal. Head: Atraumatic. Nose: No congestion/rhinnorhea. Mouth/Throat: Mucous membranes are moist. Neck: Normal ROM Cardiovascular: Normal rate, regular rhythm. Grossly normal heart sounds.  2+ radial pulses bilaterally. Respiratory: Normal respiratory effort.  No retractions. Lungs CTAB. Gastrointestinal: Soft and nontender. No distention. Genitourinary: deferred Musculoskeletal: No lower extremity tenderness nor edema. Neurologic:  Normal speech and language. No gross focal neurologic deficits are appreciated. Skin:  Skin is warm, dry and intact. No rash noted. Psychiatric: Mood and affect are normal. Speech and behavior are normal.  ____________________________________________   LABS (all labs ordered are listed, but only abnormal results are displayed)  Labs Reviewed  BASIC METABOLIC PANEL - Abnormal; Notable for the following components:      Result Value   Potassium 3.2 (*)    Glucose, Bld 141 (*)    All other components within normal limits  CBC - Abnormal; Notable for  the following components:   WBC 15.0 (*)    Hemoglobin 15.6 (*)    All other components within normal limits  TROPONIN I (HIGH SENSITIVITY) - Abnormal; Notable for the following components:   Troponin I (High Sensitivity) 606 (*)    All other components within normal limits  SARS CORONAVIRUS 2 BY RT PCR (HOSPITAL ORDER, [place] [facility])  PROTIME-INR  APTT  HEPARIN LEVEL (UNFRACTIONATED)  CBC  TROPONIN I (HIGH SENSITIVITY)   ____________________________________________  EKG  ED ECG REPORT I, Blake Divine, the attending physician, personally viewed and interpreted this ECG.   Date: 01/09/2020  EKG Time: 15:37  Rate: 74  Rhythm: normal sinus rhythm  Axis: LAD  Intervals:none  ST&T Change: None  ED ECG REPORT I, Blake Divine, the attending physician, personally viewed and interpreted this ECG.   Date: 01/09/2020  EKG Time: 16:56  Rate: 80  Rhythm:  normal sinus rhythm  Axis: LAD  Intervals:none  ST&T Change: None    PROCEDURES  Procedure(s) performed (including Critical Care):  .Critical Care Performed by: Blake Divine, MD Authorized by: Blake Divine, MD   Critical care provider statement:    Critical care time (minutes):  45   Critical care time was exclusive of:  Separately billable procedures and treating other patients and teaching time   Critical care was necessary to treat or prevent imminent or life-threatening deterioration of the following conditions:  Cardiac failure   Critical care was time spent personally by me on the following activities:  Discussions with consultants, evaluation of patient's response to treatment, examination of patient, ordering and performing treatments and interventions, ordering and review of laboratory studies, ordering and review of radiographic studies, pulse oximetry, re-evaluation of patient's condition, obtaining history from patient or surrogate and review of old charts   I assumed direction  of critical care for this patient from another provider in my specialty: no       ____________________________________________   INITIAL IMPRESSION / ASSESSMENT AND PLAN / ED COURSE      54 year old female with past medical history of IBS and lymphedema who presents to the ED with intermittent chest pain and dyspnea on exertion over the past couple of days that became constant today.  Initial EKG was unremarkable, however troponin elevated to 606 with no previously elevated troponins or CKD to explain this.  Given her symptoms, I am concerned for NSTEMI, repeat EKG shows no significant ST changes.  We will attempt to control patient's pain with sublingual nitro as well as aspirin.  Remainder of labs and chest x-ray are unremarkable, currently doubt dissection or PE.  Case discussed with Dr. Humphrey Rolls of cardiology, who will review EKG and  recommends treating with Brilinta as well as heparin drip.  Patient reports near resolution of chest pain with nitroglycerin x3 as well as aspirin.  We will hold off on nitroglycerin drip for now given improvement in pain.  Case discussed with hospitalist for admission and we will start patient on heparin drip.      ____________________________________________   FINAL CLINICAL IMPRESSION(S) / ED DIAGNOSES  Final diagnoses:  NSTEMI (non-ST elevated myocardial infarction) [facility])     ED Discharge Orders    None       Note:  This document was prepared using Dragon voice recognition software and may include unintentional dictation errors.   Blake Divine, MD 01/09/20 [phone]

## 2020-01-09 NOTE — ED Triage Notes (Signed)
Pt here for central chest pain and some DOE.  Low grade temp in triage.  NAD. VSS. Unlabored.  Color WNL.  Pain for last couple days but worse and constant today.

## 2020-01-09 NOTE — Consult Note (Signed)
[facility] for Heparin  Indication: ACS / STEMI  Allergies  Allergen Reactions  . Other Itching    Red Kool Aid    Patient Measurements: Height: 5\' 3"  (160 cm) Weight: 77.1 kg (170 lb) IBW/kg (Calculated) : 52.4 Heparin Dosing Weight: 69 kg   Vital Signs: Temp: 98.6 F (37 C) (09/20 1713) Temp Source: Oral (09/20 1713) BP: 159/98 (09/20 1716) Pulse Rate: 87 (09/20 1716)  Labs: Recent Labs    01/09/20 1550  HGB 15.6*  HCT 44.9  PLT 312  CREATININE 0.55  TROPONINIHS 606*    Estimated Creatinine Clearance: 79.1 mL/min (by C-G formula based on SCr of 0.55 mg/dL).   Medications:  Confirmed no PTA anticoagulant  Assessment: Pharmacy consulted for heparin dosing in a patient with ACS. Baseline H/H and platelet are appropriate.   Trop 606  Goal of Therapy:  Heparin level 0.3-0.7 units/ml Monitor platelets by anticoagulation protocol: Yes   Plan:  Baseline labs have been ordered  Heparin DW: 69 kg  Give 4000 units bolus x 1 Start heparin infusion at 800 units/hr Check anti-Xa level in 6 hours and daily while on heparin, per protocol Continue to monitor H&H and platelets   Noble Bodie R Maximilian Tallo 01/09/2020,5:23 PM

## 2020-01-09 NOTE — Consult Note (Signed)
Consulted to for chest pain and NSTEMI, as troponin over 600.  Three EKGs were looked at all show non-specific st and t changes as has LVH, no evidence of ST elevation. Advise heparin/brillanta 180 mg and nitrates.

## 2020-01-09 NOTE — H&P (Signed)
History and Physical    [name]:[id number] DOB: December 14, 1965 DOA: 01/09/2020  PCP: Marinda Elk, MD  Patient coming from: Home  I have personally briefly reviewed patient's old medical records in [place]  Chief Complaint: Chest pain  HPI: Valerie Braun is a 54 y.o. female with medical history significant for IBS and GERD who presents to the ED for evaluation of chest pain.  Patient states she was in her usual state of health until 01/07/2020 when she developed chest tightness and pressure-like discomfort across her chest.  She initially thought this was related to acid reflux however states her symptoms were different than her typical reflux.  Symptoms were mild until today.    She works as a Development worker, community carrier and during her route with activity she began to have significant chest pressure described as 10/10 in intensity.  She denied any radiation of her chest discomfort to her arms, neck, jaw, or back.  She had associated diaphoresis, dyspnea, palpitations, and nausea without vomiting.  She says the symptoms are occurring only with activity.  She denies any similar episodes in the past.  She denies any history of tobacco use.  She reports occasional alcohol use socially.  She reports occasional marijuana use.  She says her mother has a history of hypertension and anemia but no known significant heart disease.  She does not know her father's medical history.  She says her chest discomfort is improved to 5/10 in intensity after receiving sublingual nitroglycerin.   ED Course:  Initial vitals showed BP 165/88, pulse 72, RR 18, temp 99.4 Fahrenheit, SPO2 99% on room air.  Labs show potassium 3.2, sodium 144, bicarb 27, BUN 9, creatinine 0.55, serum glucose 141, WBC 15.0, hemoglobin 15.6, platelets 312,000, high-sensitivity troponin I 606.  SARS-CoV-2 PCR is obtained and pending.  2 view chest x-ray is negative for focal consolidation, edema, or effusion.  EKG showed  sinus rhythm with nonspecific ST changes and LVH.  Patient was given aspirin 324 mg, sublingual nitroglycerin x3, and oral K 40 mEq x 1.  On-call cardiology were consulted and recommended loading with Brilinta and starting IV heparin.  The hospitalist service was consulted to admit for further evaluation and management.  Review of Systems: All systems reviewed and are negative except as documented in history of present illness above.   Past Medical History:  Diagnosis Date  . Acute cystitis 12/27/2014  . 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 Avitaminosis D 08/25/2013  . Fatigue 08/25/2013  . Leg pain 08/25/2013    Past Surgical History:  Procedure Laterality Date  . TUBAL LIGATION      Social History:  reports that she has never smoked. She has never used smokeless tobacco. She reports that she does not drink alcohol and does not use drugs.  Allergies  Allergen Reactions  . Other Itching    Red Kool Aid    Family History  Adopted: Yes  Problem Relation Age of Onset  . Prostate cancer Maternal Uncle   . Tuberculosis Maternal Grandfather   . Breast cancer Neg Hx      Prior to Admission medications   Medication Sig Start Date End Date Taking? Authorizing Provider  Calcium Carb-Cholecalciferol (CALCIUM-VITAMIN D) 500-200 MG-UNIT tablet Take by mouth.    [provider]  clindamycin (CLEOCIN T) 1 % lotion Apply in am 05/12/17   [provider]  clindamycin (CLEOCIN T) 1 % lotion APPLY IN THE MORNING 05/12/17   [provider]  esomeprazole (NEXIUM) 40 MG capsule 1 capsule once daily. 08/30/14   [provider]  fluticasone (FLONASE) 50 MCG/ACT nasal spray SPRAY 2 SPRAYS INTO EACH NOSTRIL EVERY DAY 01/19/17   [provider]  ibuprofen (ADVIL,MOTRIN) 200 MG tablet Take by mouth.    [provider]  Linaclotide Rolan Lipa) 145 MCG CAPS capsule Take by mouth.    [provider]  meloxicam (MOBIC) 7.5 MG tablet Take 7.5 mg by mouth  daily.    [provider]  Multiple Vitamin (MULTI-VITAMINS) TABS Take by mouth.    [provider]  Olopatadine HCl 0.6 % SOLN INSTILL 2 SPRAYS INTO EACH NOSTRIL 2 TIMES A DAY 12/06/14   [provider]  Omega-3 Fatty Acids (FISH OIL) 1000 MG CAPS Take by mouth.    [provider]  phentermine (ADIPEX-P) 37.5 MG tablet Take 37.5 mg by mouth. 01/08/17   [provider]  Polyethylene Glycol 3350 (PEG 3350) POWD TAKE 17 G BY MOUTH ONCE DAILY. MIX IN 4 TO 8 OUNCES OF FLUID PRIOR TO TAKING 01/06/17   [provider]    Physical Exam: Vitals:   01/09/20 1716 01/09/20 1725 01/09/20 1740 01/09/20 1810  BP: (!) 159/98 (!) 146/96 140/90 119/80  Pulse: 87 86 80 79  Resp: 20 16 14 20   Temp:      TempSrc:      SpO2: 100% 95% 95% 98%  Weight:      Height:       Constitutional: NAD, calm, comfortable Eyes: PERRL, lids and conjunctivae normal ENMT: Mucous membranes are moist. Posterior pharynx clear of any exudate or lesions.Normal dentition.  Neck: normal, supple, no masses. Respiratory: clear to auscultation bilaterally, no wheezing, no crackles. Normal respiratory effort. No accessory muscle use.  Cardiovascular: Regular rate and rhythm, no murmurs / rubs / gallops. No extremity edema. 2+ pedal pulses. Abdomen: no tenderness, no masses palpated. No hepatosplenomegaly. Bowel sounds positive.  Musculoskeletal: no clubbing / cyanosis. No joint deformity upper and lower extremities. Good ROM, no contractures. Normal muscle tone.  Skin: no rashes, lesions, ulcers. No induration Neurologic: CN 2-12 grossly intact. Sensation intact, Strength 5/5 in all 4.  Psychiatric: Normal judgment and insight. Alert and oriented x 3. Normal mood.    Labs on Admission: I have personally reviewed following labs and imaging studies  CBC: Recent Labs  Lab 01/09/20 1550  WBC 15.0*  HGB 15.6*  HCT 44.9  MCV 91.1  PLT 629   Basic Metabolic Panel: Recent Labs    Lab 01/09/20 1550  NA 144  K 3.2*  CL 106  CO2 27  GLUCOSE 141*  BUN 9  CREATININE 0.55  CALCIUM 9.4   GFR: Estimated Creatinine Clearance: 79.1 mL/min (by C-G formula based on SCr of 0.55 mg/dL). Liver Function Tests: No results for input(s): AST, ALT, ALKPHOS, BILITOT, PROT, ALBUMIN in the last 168 hours. No results for input(s): LIPASE, AMYLASE in the last 168 hours. No results for input(s): AMMONIA in the last 168 hours. Coagulation Profile: Recent Labs  Lab 01/09/20 1805  INR 0.9   Cardiac Enzymes: No results for input(s): CKTOTAL, CKMB, CKMBINDEX, TROPONINI in the last 168 hours. BNP (last 3 results) No results for input(s): PROBNP in the last 8760 hours. HbA1C: No results for input(s): HGBA1C in the last 72 hours. CBG: No results for input(s): GLUCAP in the last 168 hours. Lipid Profile: No results for input(s): CHOL, HDL, LDLCALC, TRIG, CHOLHDL, LDLDIRECT in the last 72 hours. Thyroid Function Tests: No  results for input(s): TSH, T4TOTAL, FREET4, T3FREE, THYROIDAB in the last 72 hours. Anemia Panel: No results for input(s): VITAMINB12, FOLATE, FERRITIN, TIBC, IRON, RETICCTPCT in the last 72 hours. Urine analysis:    Component Value Date/Time   APPEARANCEUR Clear 02/06/2015 1501   GLUCOSEU Negative 02/06/2015 1501   BILIRUBINUR Negative 02/06/2015 1501   PROTEINUR Negative 02/06/2015 1501   NITRITE Negative 02/06/2015 1501   LEUKOCYTESUR 3+ (A) 02/06/2015 1501    Radiological Exams on Admission: DG Chest 2 View  Result Date: 01/09/2020 CLINICAL DATA:  Central chest pain, dyspnea on exertion, low-grade temperature EXAM: CHEST - 2 VIEW COMPARISON:  None. FINDINGS: Frontal and lateral views of the chest demonstrate an unremarkable cardiac silhouette. No acute airspace disease, effusion, or pneumothorax. No acute bony abnormalities. IMPRESSION: 1. No acute intrathoracic process. Electronically Signed   By: Randa Ngo M.D.   On: 01/09/2020 16:20    EKG:  Independently reviewed. Initial EKG showed sinus rhythm with nonspecific ST changes, LVH.  Subsequent EKGs with similar appearance.  Previous EKG from 2010 showed normal sinus rhythm with appearance of flipped lead III.  Assessment/Plan Principal Problem:   NSTEMI (non-ST elevation myocardial infarction) [facility]) Active Problems:   Hypokalemia  KAHEALANI YANKOVICH is a 54 y.o. female with medical history significant for IBS and GERD who is admitted with NSTEMI.  NSTEMI: Patient with typical symptoms and elevated/rising high-sensitivity troponin I 606 >> 1904.  EKG without ST elevation.  Symptoms improving with sublingual nitroglycerin. -Cardiology consulted -Given aspirin 324 mg, continue aspirin 81 mg daily -Loaded with Brilinta 180 mg per cardiology recommendations -Continue IV heparin anticoagulation -Obtain echocardiogram -Sublingual nitroglycerin as needed -Start atorvastatin 80 mg daily -Repeat EKG in a.m. and as needed for recurrent chest pain- -Monitor on telemetry -Keep n.p.o. at midnight  Hypokalemia: K 3.2 on admission.  Oral supplement provided.  Check magnesium and replete if needed.  DVT prophylaxis: IV heparin Code Status: Full code, confirmed with patient Family Communication: Discussed with patient, she has discussed with family Disposition Plan: From home, discharge pending further evaluation management of NSTEMI Consults called: Cardiology Admission status:  Status is: Inpatient  Remains inpatient appropriate because:Ongoing diagnostic testing needed not appropriate for outpatient work up, IV treatments appropriate due to intensity of illness or inability to take PO and Inpatient level of care appropriate due to severity of illness   Dispo: The patient is from: Home              Anticipated d/c is to: Home              Anticipated d/c date is: 2 days              Patient currently is not medically stable to d/c.   Zada Finders MD [facility]  If  7PM-7AM, please contact night-coverage [email or web address]  01/09/2020, 6:27 PM

## 2020-01-10 ENCOUNTER — Encounter: Payer: Self-pay | Admitting: Internal Medicine

## 2020-01-10 ENCOUNTER — Other Ambulatory Visit: Payer: Self-pay

## 2020-01-10 ENCOUNTER — Encounter
Admission: EM | Disposition: A | Discharge status: Home / Self Care | Payer: Self-pay | Source: Home / Self Care | Attending: Internal Medicine

## 2020-01-10 DIAGNOSIS — I2510 Atherosclerotic heart disease of native coronary artery without angina pectoris: Secondary | ICD-10-CM

## 2020-01-10 HISTORY — PX: LEFT HEART CATH AND CORONARY ANGIOGRAPHY: CATH118249

## 2020-01-10 HISTORY — DX: Atherosclerotic heart disease of native coronary artery without angina pectoris: I25.10

## 2020-01-10 LAB — PROTIME-INR
INR: 1 (ref 0.8–1.2)
Prothrombin Time: 12.6 seconds (ref 11.4–15.2)

## 2020-01-10 LAB — CBC
HCT: 41.4 % (ref 36.0–46.0)
HCT: 43.6 % (ref 36.0–46.0)
Hemoglobin: 14.4 g/dL (ref 12.0–15.0)
Hemoglobin: 14.7 g/dL (ref 12.0–15.0)
MCH: 32.1 pg (ref 26.0–34.0)
MCH: 31.6 pg (ref 26.0–34.0)
MCHC: 34.8 g/dL (ref 30.0–36.0)
MCHC: 33.7 g/dL (ref 30.0–36.0)
MCV: 92.4 fL (ref 80.0–100.0)
MCV: 93.8 fL (ref 80.0–100.0)
Platelets: 271 10*3/uL (ref 150–400)
Platelets: 273 10*3/uL (ref 150–400)
RBC: 4.48 MIL/uL (ref 3.87–5.11)
RBC: 4.65 MIL/uL (ref 3.87–5.11)
RDW: 13.7 % (ref 11.5–15.5)
RDW: 13.6 % (ref 11.5–15.5)
WBC: 9.6 10*3/uL (ref 4.0–10.5)
WBC: 9.6 10*3/uL (ref 4.0–10.5)
nRBC: 0 % (ref 0.0–0.2)
nRBC: 0 % (ref 0.0–0.2)

## 2020-01-10 LAB — BASIC METABOLIC PANEL
Anion gap: 11 (ref 5–15)
Anion gap: 10 (ref 5–15)
BUN: 9 mg/dL (ref 6–20)
BUN: 8 mg/dL (ref 6–20)
CO2: 23 mmol/L (ref 22–32)
CO2: 26 mmol/L (ref 22–32)
Calcium: 9 mg/dL (ref 8.9–10.3)
Calcium: 9.3 mg/dL (ref 8.9–10.3)
Chloride: 107 mmol/L (ref 98–111)
Chloride: 104 mmol/L (ref 98–111)
Creatinine, Ser: 0.74 mg/dL (ref 0.44–1.00)
Creatinine, Ser: 0.64 mg/dL (ref 0.44–1.00)
GFR calc Af Amer: >60 mL/min (ref >60)
GFR calc Af Amer: >60 mL/min (ref >60)
GFR calc non Af Amer: >60 mL/min (ref >60)
GFR calc non Af Amer: >60 mL/min (ref >60)
Glucose, Bld: 110 mg/dL — ABNORMAL HIGH (ref 70–99)
Glucose, Bld: 96 mg/dL (ref 70–99)
Potassium: 3.4 mmol/L — ABNORMAL LOW (ref 3.5–5.1)
Potassium: 4 mmol/L (ref 3.5–5.1)
Sodium: 141 mmol/L (ref 135–145)
Sodium: 140 mmol/L (ref 135–145)

## 2020-01-10 LAB — HEPARIN LEVEL (UNFRACTIONATED)
Heparin Unfractionated: 0.35 IU/mL (ref 0.30–0.70)
Heparin Unfractionated: 0.32 IU/mL (ref 0.30–0.70)

## 2020-01-10 LAB — ECHOCARDIOGRAM COMPLETE
Area-P 1/2: 2.66 cm2
Calc EF: 33.3 %
Height: 63 in
S' Lateral: 2.56 cm
Single Plane A2C EF: 35.3 %
Single Plane A4C EF: 31.5 %
Weight: 2720 oz

## 2020-01-10 LAB — TROPONIN I (HIGH SENSITIVITY): Troponin I (High Sensitivity): 3856 ng/L (ref <18)

## 2020-01-10 LAB — LIPID PANEL
Cholesterol: 183 mg/dL (ref 0–200)
HDL: 56 mg/dL (ref >40)
LDL Cholesterol: 111 mg/dL — ABNORMAL HIGH (ref 0–99)
Total CHOL/HDL Ratio: 3.3 RATIO
Triglycerides: 79 mg/dL (ref <150)
VLDL: 16 mg/dL (ref 0–40)

## 2020-01-10 LAB — HIV ANTIBODY (ROUTINE TESTING W REFLEX): HIV Screen 4th Generation wRfx: NONREACTIVE

## 2020-01-10 LAB — HEMOGLOBIN A1C
Hgb A1c MFr Bld: 5.5 % (ref 4.8–5.6)
Mean Plasma Glucose: 111.15 mg/dL

## 2020-01-10 LAB — MAGNESIUM: Magnesium: 2.2 mg/dL (ref 1.7–2.4)

## 2020-01-10 SURGERY — LEFT HEART CATH AND CORONARY ANGIOGRAPHY
Anesthesia: Moderate Sedation

## 2020-01-10 MED ORDER — SODIUM CHLORIDE 0.9% FLUSH: 3.0000 mL | INTRAVENOUS | Status: DC | PRN

## 2020-01-10 MED ORDER — TICAGRELOR 90 MG PO TABS
90.0000 mg | ORAL_TABLET | Freq: Two times a day (BID) | ORAL | Status: DC
  Administered 2020-01-10 – 2020-01-12 (×4): 90 mg via ORAL
  Filled 2020-01-10 (×4): qty 1

## 2020-01-10 MED ORDER — IOHEXOL 300 MG/ML  SOLN
INTRAMUSCULAR | Status: DC | PRN
  Administered 2020-01-10: 85 mL

## 2020-01-10 MED ORDER — MIDAZOLAM HCL 2 MG/2ML IJ SOLN
INTRAMUSCULAR | Status: AC
  Filled 2020-01-10: qty 2

## 2020-01-10 MED ORDER — MIDAZOLAM HCL 2 MG/2ML IJ SOLN
INTRAMUSCULAR | Status: DC | PRN
  Administered 2020-01-10: 1 mg via INTRAVENOUS

## 2020-01-10 MED ORDER — ASPIRIN 81 MG PO CHEW: 81.0000 mg | CHEWABLE_TABLET | ORAL | Status: DC

## 2020-01-10 MED ORDER — HYDRALAZINE HCL 20 MG/ML IJ SOLN: 10.0000 mg | INTRAMUSCULAR | Status: AC | PRN

## 2020-01-10 MED ORDER — SACUBITRIL-VALSARTAN 24-26 MG PO TABS
1.0000 | ORAL_TABLET | Freq: Two times a day (BID) | ORAL | Status: DC
  Administered 2020-01-10 – 2020-01-12 (×4): 1 via ORAL
  Filled 2020-01-10 (×5): qty 1

## 2020-01-10 MED ORDER — ACETAMINOPHEN 325 MG PO TABS: 650.0000 mg | ORAL_TABLET | ORAL | Status: DC | PRN

## 2020-01-10 MED ORDER — SODIUM CHLORIDE 0.9% FLUSH
3.0000 mL | Freq: Two times a day (BID) | INTRAVENOUS | Status: DC
  Administered 2020-01-11 (×2): 3 mL via INTRAVENOUS

## 2020-01-10 MED ORDER — SODIUM CHLORIDE 0.9 % WEIGHT BASED INFUSION
3.0000 mL/kg/h | INTRAVENOUS | Status: DC
  Administered 2020-01-10: 3 mL/kg/h via INTRAVENOUS

## 2020-01-10 MED ORDER — HEPARIN (PORCINE) IN NACL 1000-0.9 UT/500ML-% IV SOLN
INTRAVENOUS | Status: AC
  Filled 2020-01-10: qty 1000

## 2020-01-10 MED ORDER — FENTANYL CITRATE (PF) 100 MCG/2ML IJ SOLN
INTRAMUSCULAR | Status: DC | PRN
Start: 2020-01-10 — End: 2020-01-10
  Administered 2020-01-10: 50 ug via INTRAVENOUS

## 2020-01-10 MED ORDER — FENTANYL CITRATE (PF) 100 MCG/2ML IJ SOLN
INTRAMUSCULAR | Status: AC
  Filled 2020-01-10: qty 2

## 2020-01-10 MED ORDER — SODIUM CHLORIDE 0.9 % WEIGHT BASED INFUSION
1.0000 mL/kg/h | INTRAVENOUS | Status: AC
  Administered 2020-01-10: 1 mL/kg/h via INTRAVENOUS

## 2020-01-10 MED ORDER — SODIUM CHLORIDE 0.9 % WEIGHT BASED INFUSION: 1.0000 mL/kg/h | INTRAVENOUS | Status: DC

## 2020-01-10 MED ORDER — SODIUM CHLORIDE 0.9 % IV SOLN: 250.0000 mL | INTRAVENOUS | Status: DC | PRN

## 2020-01-10 MED ORDER — POTASSIUM CHLORIDE CRYS ER 20 MEQ PO TBCR
40.0000 meq | EXTENDED_RELEASE_TABLET | Freq: Once | ORAL | Status: AC
  Administered 2020-01-10: 40 meq via ORAL
  Filled 2020-01-10: qty 2

## 2020-01-10 MED ORDER — METOPROLOL SUCCINATE ER 100 MG PO TB24
100.0000 mg | ORAL_TABLET | Freq: Every day | ORAL | Status: DC
  Administered 2020-01-10 – 2020-01-12 (×3): 100 mg via ORAL
  Filled 2020-01-10 (×3): qty 1

## 2020-01-10 MED ORDER — HEPARIN (PORCINE) IN NACL 1000-0.9 UT/500ML-% IV SOLN
INTRAVENOUS | Status: DC | PRN
  Administered 2020-01-10: 500 mL

## 2020-01-10 MED ORDER — ONDANSETRON HCL 4 MG/2ML IJ SOLN: 4.0000 mg | Freq: Four times a day (QID) | INTRAMUSCULAR | Status: DC | PRN

## 2020-01-10 MED ORDER — LABETALOL HCL 5 MG/ML IV SOLN: 10.0000 mg | INTRAVENOUS | Status: AC | PRN

## 2020-01-10 SURGICAL SUPPLY — 10 items
CATH INFINITI 5FR ANG PIGTAIL (CATHETERS) ×3 IMPLANT
CATH INFINITI 5FR JL4 (CATHETERS) ×3 IMPLANT
CATH INFINITI JR4 5F (CATHETERS) ×3 IMPLANT
DEVICE CLOSURE MYNXGRIP 5F (Vascular Products) ×3 IMPLANT
KIT MANI 3VAL PERCEP (MISCELLANEOUS) ×3 IMPLANT
NEEDLE PERC 18GX7CM (NEEDLE) ×3 IMPLANT
PACK CARDIAC CATH (CUSTOM PROCEDURE TRAY) ×3 IMPLANT
SHEATH AVANTI 5FR X 11CM (SHEATH) ×3 IMPLANT
SYR MEDRAD MARK 7 150ML (SYRINGE) ×3 IMPLANT
WIRE GUIDERIGHT .035X150 (WIRE) ×3 IMPLANT

## 2020-01-10 NOTE — ED Notes (Signed)
Report given to [facility] in special procedures.

## 2020-01-10 NOTE — Consult Note (Signed)
ANTICOAGULATION CONSULT NOTE    Pharmacy Consult for Heparin  Indication: ACS / STEMI  Allergies  Allergen Reactions   Other Itching    Red Kool Aid    Patient Measurements: Height: 5\' 3"  (160 cm) Weight: 77.2 kg (170 lb 1.6 oz) IBW/kg (Calculated) : 52.4 Heparin Dosing Weight: 69 kg   Vital Signs: Temp: 98.2 F (36.8 C) (09/21 1704) Temp Source: Oral (09/21 1704) BP: 145/103 (09/21 1704) Pulse Rate: 85 (09/21 1704)  Labs: Recent Labs    01/09/20 1550 01/09/20 1550 01/09/20 1800 01/09/20 1805 01/09/20 2226 01/10/20 0046 01/10/20 0359 01/10/20 0640 01/10/20 1209  HGB 15.6*   < >  --   --   --   --  14.4  --  14.7  HCT 44.9  --   --   --   --   --  41.4  --  43.6  PLT 312  --   --   --   --   --  271  --  273  APTT  --   --   --  31  --   --   --   --   --   LABPROT  --   --   --  12.2  --   --   --   --  12.6  INR  --   --   --  0.9  --   --   --   --  1.0  HEPARINUNFRC  --   --   --   --   --  0.35  --  0.32  --   CREATININE 0.55  --   --   --   --   --  0.74  --  0.64  TROPONINIHS 606*  --  1,904*  --  3,856*  --   --   --   --    < > = values in this interval not displayed.    Estimated Creatinine Clearance: 79.1 mL/min (by C-G formula based on SCr of 0.64 mg/dL).   Medications:  Confirmed no PTA anticoagulant  Assessment: Pharmacy consulted for heparin dosing in a patient with ACS. Baseline H/H and platelet are appropriate.   Trop [phone]  Goal of Therapy:  Heparin level 0.3-0.7 units/ml Monitor platelets by anticoagulation protocol: Yes   Plan:  Baseline labs have been ordered  Heparin DW: 69 kg  Give 4000 units bolus x 1 Start heparin infusion at 800 units/hr Check anti-Xa level in 6 hours and daily while on heparin, per protocol Continue to monitor H&H and platelets  9/21 : HL @ 0046 = 0.35 9/21 : HL @ 0640 = 0.32 @ 800 units/hr  9/21 - patient went to Cath lab and according to RN they are continuing Heparin drip post Cath.  REsuming at 850 units/hr and will check HL in 6 hrs per protocol.  Noralee Space, PharmD, BCPS Clinical Pharmacist 01/10/2020 5:42 PM

## 2020-01-10 NOTE — Progress Notes (Signed)
PROGRESS NOTE    Valerie Braun  IWO:[id number] DOB: 05-01-1965 DOA: 01/09/2020 PCP: Marinda Elk, MD  Outpatient Specialists: none    Brief Narrative:  Valerie Braun is a 54 y.o. female with medical history significant for IBS and GERD who presents to the ED for evaluation of chest pain.  Patient states she was in her usual state of health until 01/07/2020 when she developed chest tightness and pressure-like discomfort across her chest.  She initially thought this was related to acid reflux however states her symptoms were different than her typical reflux.  Symptoms were mild until today.    She works as a Development worker, community carrier and during her route with activity she began to have significant chest pressure described as 10/10 in intensity.  She denied any radiation of her chest discomfort to her arms, neck, jaw, or back.  She had associated diaphoresis, dyspnea, palpitations, and nausea without vomiting.  She says the symptoms are occurring only with activity.  She denies any similar episodes in the past.  She denies any history of tobacco use.  She reports occasional alcohol use socially.  She reports occasional marijuana use.  She says her mother has a history of hypertension and anemia but no known significant heart disease.  She does not know her father's medical history.  She says her chest discomfort is improved to 5/10 in intensity after receiving sublingual nitroglycerin.   ED Course:  Initial vitals showed BP 165/88, pulse 72, RR 18, temp 99.4 Fahrenheit, SPO2 99% on room air.  2 view chest x-ray is negative for focal consolidation, edema, or effusion.  EKG showed sinus rhythm with nonspecific ST changes and LVH.  Patient was given aspirin 324 mg, sublingual nitroglycerin x3, and oral K 40 mEq x 1.  On-call cardiology were consulted and recommended loading with Brilinta and starting IV heparin.  The hospitalist service was consulted to admit for further evaluation and  management.   Assessment & Plan:   Principal Problem:   NSTEMI (non-ST elevation myocardial infarction) ([place]) Active Problems:   Hypokalemia  NSTEMI: Patient with typical symptoms and elevated/rising high-sensitivity troponin I 606 >> 1904.  EKG without ST elevation.  Symptoms resolved with sublingual nitroglycerin.  Anterior TWIs on this AM's EKG. No known CAD but does have family history (mother). No toxic habits. -Cardiology consulted, today's recs pending but appears plan for cath.-Given aspirin 324 mg, continue aspirin 81 mg daily -Loaded with Brilinta 180 mg per cardiology recommendations -Continue IV heparin anticoagulation -Obtain echocardiogram, pending -Sublingual nitroglycerin as needed - start atorvastatin 80 mg daily -Monitor on telemetry -maintain npo  Hypokalemia: K 3.2 on admission. Received 40, today 3.4, will order another 40  IBS GERD - resume home linzess and ppi when tolerating PO   DVT prophylaxis: therapeutic heparin Code Status: full Family Communication: will attempt to update daughter Valerie Braun later today Disposition Plan: tbd  Status is: inpatient  Remains inpatient appropriate because: acute illness requiring inpatient evaluation and treatment   Dispo: The patient is from: home              Anticipated d/c is to: home              Anticipated d/c date is: 9/24              Consultants:  cardiology  Procedures: none  Antimicrobials:  none    Subjective: Chest pain resolve but does get a little SOB when moving around. No n/v/d. Has appetite  Objective:  Vitals:   01/10/20 0500 01/10/20 0530 01/10/20 0600 01/10/20 0630  BP: (!) 134/96 127/86 134/87 120/80  Pulse: 78 79 73 79  Resp: (!) 21 20 (!) 21 (!) 21  Temp:      TempSrc:      SpO2: 98% 98% 98% 98%  Weight:      Height:       No intake or output data in the 24 hours ending 01/10/20 0837 Filed Weights   01/09/20 1548  Weight: 77.1 kg    Examination:  General exam:  Appears calm and comfortable  Respiratory system: Clear to auscultation. Respiratory effort normal. Cardiovascular system: S1 & S2 heard, RRR. No JVD, murmurs, rubs, gallops or clicks. No pedal edema. Gastrointestinal system: Abdomen is nondistended, soft and nontender. No organomegaly or masses felt. Normal bowel sounds heard. Central nervous system: Alert and oriented. No focal neurological deficits. Extremities: Symmetric 5 x 5 power. Skin: No rashes, lesions or ulcers Psychiatry: Judgement and insight appear normal. Mood & affect appropriate.     Data Reviewed: I have personally reviewed following labs and imaging studies  CBC: Recent Labs  Lab 01/09/20 1550 01/10/20 0359  WBC 15.0* 9.6  HGB 15.6* 14.4  HCT 44.9 41.4  MCV 91.1 92.4  PLT 312 409   Basic Metabolic Panel: Recent Labs  Lab 01/09/20 1550 01/09/20 2226 01/10/20 0359  NA 144  --  141  K 3.2*  --  3.4*  CL 106  --  107  CO2 27  --  23  GLUCOSE 141*  --  110*  BUN 9  --  9  CREATININE 0.55  --  0.74  CALCIUM 9.4  --  9.0  MG  --  2.2  --    GFR: Estimated Creatinine Clearance: 79.1 mL/min (by C-G formula based on SCr of 0.74 mg/dL). Liver Function Tests: No results for input(s): AST, ALT, ALKPHOS, BILITOT, PROT, ALBUMIN in the last 168 hours. No results for input(s): LIPASE, AMYLASE in the last 168 hours. No results for input(s): AMMONIA in the last 168 hours. Coagulation Profile: Recent Labs  Lab 01/09/20 1805  INR 0.9   Cardiac Enzymes: No results for input(s): CKTOTAL, CKMB, CKMBINDEX, TROPONINI in the last 168 hours. BNP (last 3 results) No results for input(s): PROBNP in the last 8760 hours. HbA1C: No results for input(s): HGBA1C in the last 72 hours. CBG: No results for input(s): GLUCAP in the last 168 hours. Lipid Profile: Recent Labs    01/10/20 0359  CHOL 183  HDL 56  LDLCALC 111*  TRIG 79  CHOLHDL 3.3   Thyroid Function Tests: No results for input(s): TSH, T4TOTAL, FREET4,  T3FREE, THYROIDAB in the last 72 hours. Anemia Panel: No results for input(s): VITAMINB12, FOLATE, FERRITIN, TIBC, IRON, RETICCTPCT in the last 72 hours. Urine analysis:    Component Value Date/Time   APPEARANCEUR Clear 02/06/2015 1501   GLUCOSEU Negative 02/06/2015 1501   BILIRUBINUR Negative 02/06/2015 1501   PROTEINUR Negative 02/06/2015 1501   NITRITE Negative 02/06/2015 1501   LEUKOCYTESUR 3+ (A) 02/06/2015 1501   Sepsis Labs: @LABRCNTIP (procalcitonin:4,lacticidven:4)  ) Recent Results (from the past 240 hour(s))  SARS Coronavirus 2 by RT PCR (hospital order, performed in [facility] hospital lab) Nasopharyngeal Nasopharyngeal Swab     Status: None   Collection Time: 01/09/20  5:16 PM   Specimen: Nasopharyngeal Swab  Result Value Ref Range Status   SARS Coronavirus 2 NEGATIVE NEGATIVE Final    Comment: (NOTE) SARS-CoV-2 target nucleic acids are  NOT DETECTED.  The SARS-CoV-2 RNA is generally detectable in upper and lower respiratory specimens during the acute phase of infection. The lowest concentration of SARS-CoV-2 viral copies this assay can detect is 250 copies / mL. A negative result does not preclude SARS-CoV-2 infection and should not be used as the sole basis for treatment or other patient management decisions.  A negative result may occur with improper specimen collection / handling, submission of specimen other than nasopharyngeal swab, presence of viral mutation(s) within the areas targeted by this assay, and inadequate number of viral copies (<250 copies / mL). A negative result must be combined with clinical observations, patient history, and epidemiological information.  Fact Sheet for Patients:   StrictlyIdeas.no  Fact Sheet for Healthcare Providers: BankingDealers.co.za  This test is not yet approved or  cleared by the [place] FDA and has been authorized for detection and/or diagnosis of SARS-CoV-2  by FDA under an Emergency Use Authorization (EUA).  This EUA will remain in effect (meaning this test can be used) for the duration of the COVID-19 declaration under Section 564(b)(1) of the Act, 21 U.S.C. section 360bbb-3(b)(1), unless the authorization is terminated or revoked sooner.  Performed at [facility], [address] [place], [place] [address]          Radiology Studies: DG Chest 2 View  Result Date: 01/09/2020 CLINICAL DATA:  Central chest pain, dyspnea on exertion, low-grade temperature EXAM: CHEST - 2 VIEW COMPARISON:  None. FINDINGS: Frontal and lateral views of the chest demonstrate an unremarkable cardiac silhouette. No acute airspace disease, effusion, or pneumothorax. No acute bony abnormalities. IMPRESSION: 1. No acute intrathoracic process. Electronically Signed   By: Randa Ngo M.D.   On: 01/09/2020 16:20   ECHOCARDIOGRAM COMPLETE  Result Date: 01/10/2020    ECHOCARDIOGRAM REPORT   Patient Name:   Valerie Braun Date of Exam: 01/09/2020 Medical Rec #:  [id number]       Height:       63.0 in Accession #:    [id number]      Weight:       170.0 lb Date of Birth:  12-29-1965       BSA:          1.805 m Patient Age:    12 years        BP:           156/102 mmHg Patient Gender: F               HR:           75 bpm. Exam Location:  [facility] Procedure: 2D Echo Indications:     NSTEMI I21.4  History:         Patient has no prior history of Echocardiogram examinations.                  Acute MI.  Sonographer:     Avanell Shackleton Referring Phys:  [id number] Cleaster Corin PATEL Diagnosing Phys: Yolonda Kida MD IMPRESSIONS  1. Left ventricular ejection fraction, by estimation, is 55 to 60%. The left ventricle has normal function. The left ventricle has no regional wall motion abnormalities. Left ventricular diastolic parameters are consistent with Grade I diastolic dysfunction (impaired relaxation).  2. Right ventricular systolic function is normal. The right ventricular  size is normal.  3. Left atrial size was mildly dilated.  4. Right atrial size was mildly dilated.  5. The mitral valve is normal in structure. Mild mitral valve  regurgitation.  6. Tricuspid valve regurgitation is mild to moderate.  7. The aortic valve is normal in structure. Aortic valve regurgitation is trivial. FINDINGS  Left Ventricle: Left ventricular ejection fraction, by estimation, is 55 to 60%. The left ventricle has normal function. The left ventricle has no regional wall motion abnormalities. The left ventricular internal cavity size was normal in size. There is  no left ventricular hypertrophy. Left ventricular diastolic parameters are consistent with Grade I diastolic dysfunction (impaired relaxation). Right Ventricle: The right ventricular size is normal. No increase in right ventricular wall thickness. Right ventricular systolic function is normal. Left Atrium: Left atrial size was mildly dilated. Right Atrium: Right atrial size was mildly dilated. Pericardium: There is no evidence of pericardial effusion. Mitral Valve: The mitral valve is normal in structure. Mild mitral valve regurgitation. Tricuspid Valve: The tricuspid valve is normal in structure. Tricuspid valve regurgitation is mild to moderate. Aortic Valve: The aortic valve is normal in structure. Aortic valve regurgitation is trivial. Pulmonic Valve: The pulmonic valve was normal in structure. Pulmonic valve regurgitation is not visualized. Aorta: The aortic root was not well visualized. IAS/Shunts: No atrial level shunt detected by color flow Doppler.  LEFT VENTRICLE PLAX 2D LVIDd:         3.74 cm      Diastology LVIDs:         2.56 cm      LV e' medial:    6.64 cm/s LV PW:         0.99 cm      LV E/e' medial:  15.2 LV IVS:        0.97 cm      LV e' lateral:   4.90 cm/s LVOT diam:     1.80 cm      LV E/e' lateral: 20.6 LVOT Area:     2.54 cm  LV Volumes (MOD) LV vol d, MOD A2C: 122.0 ml LV vol d, MOD A4C: 109.0 ml LV vol s, MOD A2C: 78.9  ml LV vol s, MOD A4C: 74.7 ml LV SV MOD A2C:     43.1 ml LV SV MOD A4C:     109.0 ml LV SV MOD BP:      38.6 ml RIGHT VENTRICLE             IVC RV S prime:     11.10 cm/s  IVC diam: 1.25 cm LEFT ATRIUM             Index       RIGHT ATRIUM           Index LA diam:        3.40 cm 1.88 cm/m  RA Area:     11.70 cm LA Vol (A2C):   49.8 ml 27.60 ml/m RA Volume:   29.90 ml  16.57 ml/m LA Vol (A4C):   38.5 ml 21.33 ml/m LA Biplane Vol: 44.1 ml 24.44 ml/m   AORTA Ao Root diam: 2.80 cm MITRAL VALVE                TRICUSPID VALVE MV Area (PHT): 2.66 cm     TR Peak grad:   25.4 mmHg MV Decel Time: 285 msec     TR Vmax:        252.00 cm/s MV E velocity: 101.00 cm/s MV A velocity: 124.00 cm/s  SHUNTS MV E/A ratio:  0.81         Systemic Diam: 1.80 cm Yolonda Kida MD Electronically  signed by Yolonda Kida MD Signature Date/Time: 01/10/2020/7:58:25 AM    Final         Scheduled Meds: . aspirin EC  81 mg Oral Daily  . atorvastatin  80 mg Oral QHS  . potassium chloride  40 mEq Oral Once  . sodium chloride flush  3 mL Intravenous Q12H   Continuous Infusions: . heparin 850 Units/hr (01/10/20 0750)     LOS: 1 day    Time spent: 40 min    Desma Maxim, MD [facility]   If 7PM-7AM, please contact night-coverage [email or web address] Password [email or web address] 01/10/2020, 8:37 AM

## 2020-01-10 NOTE — Consult Note (Signed)
[place] for Heparin  Indication: ACS / STEMI  Allergies  Allergen Reactions  . Other Itching    Red Kool Aid    Patient Measurements: Height: 5\' 3"  (160 cm) Weight: 77.1 kg (170 lb) IBW/kg (Calculated) : 52.4 Heparin Dosing Weight: 69 kg   Vital Signs: BP: 120/80 (09/21 0630) Pulse Rate: 79 (09/21 0630)  Labs: Recent Labs    01/09/20 1550 01/09/20 1800 01/09/20 1805 01/09/20 2226 01/10/20 0046 01/10/20 0359 01/10/20 0640  HGB 15.6*  --   --   --   --  14.4  --   HCT 44.9  --   --   --   --  41.4  --   PLT 312  --   --   --   --  271  --   APTT  --   --  31  --   --   --   --   LABPROT  --   --  12.2  --   --   --   --   INR  --   --  0.9  --   --   --   --   HEPARINUNFRC  --   --   --   --  0.35  --  0.32  CREATININE 0.55  --   --   --   --  0.74  --   TROPONINIHS 606* 1,904*  --  3,856*  --   --   --     Estimated Creatinine Clearance: 79.1 mL/min (by C-G formula based on SCr of 0.74 mg/dL).   Medications:  Confirmed no PTA anticoagulant  Assessment: Pharmacy consulted for heparin dosing in a patient with ACS. Baseline H/H and platelet are appropriate.   Trop [phone]  Goal of Therapy:  Heparin level 0.3-0.7 units/ml Monitor platelets by anticoagulation protocol: Yes   Plan:  Baseline labs have been ordered  Heparin DW: 69 kg  Give 4000 units bolus x 1 Start heparin infusion at 800 units/hr Check anti-Xa level in 6 hours and daily while on heparin, per protocol Continue to monitor H&H and platelets  9/21 : HL @ 0046 = 0.35 9/21 : HL @ 0640 = 0.32 @ 800 units/hr  Therapeutic with slight downward trend, will increase rate to 850 units/hr and recheck HL in 6 hrs per protocol.  Lu Duffel, PharmD, BCPS Clinical Pharmacist 01/10/2020 7:38 AM

## 2020-01-10 NOTE — Consult Note (Signed)
Valerie Braun is a 54 y.o. female  [id number]  Primary Cardiologist: Neoma Laming Reason for Consultation: NSTEMI  HPI: Patient is a 54 year old female with past medical history of GERD who presented to the emergency department with chest discomfort.  Patient states that on 9/18, she developed midsternal pressure in her chest which continued to worsen overtime.  Patient denied any radiation of the pressure but did have associated diaphoresis and dyspnea.  In ED, chest pain resolved after having 3 doses of sublingual nitro.  On presentation patient did have an increased troponin that has since continued to increase to 3856.  Most recent EKG with T wave abnormalities both inferior and anterior.  We have been consulted for NSTEMI and catheterization.   Review of Systems: At this time patient denies shortness of breath or chest pain.     Past Medical History:  Diagnosis Date  . Acute cystitis 12/27/2014  . 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 Avitaminosis D 08/25/2013  . Fatigue 08/25/2013  . Leg pain 08/25/2013    (Not in a hospital admission)    . aspirin EC  81 mg Oral Daily  . atorvastatin  80 mg Oral QHS  . sodium chloride flush  3 mL Intravenous Q12H    Infusions: . heparin 850 Units/hr (01/10/20 0750)    Allergies  Allergen Reactions  . Other Itching    Red Kool Aid    Social History   Socioeconomic History  . Marital status: Single    Spouse name: Not on file  . Number of children: Not on file  . Years of education: Not on file  . Highest education level: Not on file  Occupational History  . Not on file  Tobacco Use  . Smoking status: Never Smoker  . Smokeless tobacco: Never Used  Substance and Sexual Activity  . Alcohol use: No    Alcohol/week: 0.0 standard drinks  . Drug use: No  . Sexual activity: Not on file  Other Topics Concern  . Not on file  Social History Narrative  . Not on file   Social Determinants of Health   Financial Resource Strain:   .  Difficulty of Paying Living Expenses: Not on file  Food Insecurity:   . Worried About Charity fundraiser in the Last Year: Not on file  . Ran Out of Food in the Last Year: Not on file  Transportation Needs:   . Lack of Transportation (Medical): Not on file  . Lack of Transportation (Non-Medical): Not on file  Physical Activity:   . Days of Exercise per Week: Not on file  . Minutes of Exercise per Session: Not on file  Stress:   . Feeling of Stress : Not on file  Social Connections:   . Frequency of Communication with Friends and Family: Not on file  . Frequency of Social Gatherings with Friends and Family: Not on file  . Attends Religious Services: Not on file  . Active Member of Clubs or Organizations: Not on file  . Attends Archivist Meetings: Not on file  . Marital Status: Not on file  Intimate Partner Violence:   . Fear of Current or Ex-Partner: Not on file  . Emotionally Abused: Not on file  . Physically Abused: Not on file  . Sexually Abused: Not on file    Family History  Adopted: Yes  Problem Relation Age of Onset  . Prostate cancer Maternal Uncle   . Tuberculosis Maternal Grandfather   .  Hypertension Mother   . Anemia Mother   . Breast cancer Neg Hx     PHYSICAL EXAM: Vitals:   01/10/20 0600 01/10/20 0630  BP: 134/87 120/80  Pulse: 73 79  Resp: (!) 21 (!) 21  Temp:    SpO2: 98% 98%    No intake or output data in the 24 hours ending 01/10/20 1009  General:  Well appearing. No respiratory difficulty HEENT: normal Neck: supple. no JVD. Carotids 2+ bilat; no bruits. No lymphadenopathy or thryomegaly appreciated. Cor: PMI nondisplaced. Regular rate & rhythm. No rubs, gallops or murmurs. Lungs: clear Abdomen: soft, nontender, nondistended. No hepatosplenomegaly. No bruits or masses. Good bowel sounds. Extremities: no cyanosis, clubbing, rash, edema Neuro: alert & oriented x 3, cranial nerves grossly intact. moves all 4 extremities w/o difficulty.  Affect pleasant.  ECG: NSR with T wave abnormalities inferior and anterior.  69/BPM.  Results for orders placed or performed during the hospital encounter of 01/09/20 (from the past 24 hour(s))  Basic metabolic panel     Status: Abnormal   Collection Time: 01/09/20  3:50 PM  Result Value Ref Range   Sodium 144 135 - 145 mmol/L   Potassium 3.2 (L) 3.5 - 5.1 mmol/L   Chloride 106 98 - 111 mmol/L   CO2 27 22 - 32 mmol/L   Glucose, Bld 141 (H) 70 - 99 mg/dL   BUN 9 6 - 20 mg/dL   Creatinine, Ser 0.55 0.44 - 1.00 mg/dL   Calcium 9.4 8.9 - 10.3 mg/dL   GFR calc non Af Amer >60 >60 mL/min   GFR calc Af Amer >60 >60 mL/min   Anion gap 11 5 - 15  CBC     Status: Abnormal   Collection Time: 01/09/20  3:50 PM  Result Value Ref Range   WBC 15.0 (H) 4.0 - 10.5 K/uL   RBC 4.93 3.87 - 5.11 MIL/uL   Hemoglobin 15.6 (H) 12.0 - 15.0 g/dL   HCT 44.9 36 - 46 %   MCV 91.1 80.0 - 100.0 fL   MCH 31.6 26.0 - 34.0 pg   MCHC 34.7 30.0 - 36.0 g/dL   RDW 13.4 11.5 - 15.5 %   Platelets 312 150 - 400 K/uL   nRBC 0.0 0.0 - 0.2 %  Troponin I (High Sensitivity)     Status: Abnormal   Collection Time: 01/09/20  3:50 PM  Result Value Ref Range   Troponin I (High Sensitivity) 606 (HH) <18 ng/L  SARS Coronavirus 2 by RT PCR (hospital order, performed in [place] hospital lab) Nasopharyngeal Nasopharyngeal Swab     Status: None   Collection Time: 01/09/20  5:16 PM   Specimen: Nasopharyngeal Swab  Result Value Ref Range   SARS Coronavirus 2 NEGATIVE NEGATIVE  Troponin I (High Sensitivity)     Status: Abnormal   Collection Time: 01/09/20  6:00 PM  Result Value Ref Range   Troponin I (High Sensitivity) 1,904 (HH) <18 ng/L  Protime-INR     Status: None   Collection Time: 01/09/20  6:05 PM  Result Value Ref Range   Prothrombin Time 12.2 11.4 - 15.2 seconds   INR 0.9 0.8 - 1.2  APTT     Status: None   Collection Time: 01/09/20  6:05 PM  Result Value Ref Range   aPTT 31 24 - 36 seconds  Magnesium      Status: None   Collection Time: 01/09/20 10:26 PM  Result Value Ref Range   Magnesium 2.2 1.7 -  2.4 mg/dL  Troponin I (High Sensitivity)     Status: Abnormal   Collection Time: 01/09/20 10:26 PM  Result Value Ref Range   Troponin I (High Sensitivity) 3,856 (HH) <18 ng/L  Heparin level (unfractionated)     Status: None   Collection Time: 01/10/20 12:46 AM  Result Value Ref Range   Heparin Unfractionated 0.35 0.30 - 0.70 IU/mL  CBC     Status: None   Collection Time: 01/10/20  3:59 AM  Result Value Ref Range   WBC 9.6 4.0 - 10.5 K/uL   RBC 4.48 3.87 - 5.11 MIL/uL   Hemoglobin 14.4 12.0 - 15.0 g/dL   HCT 41.4 36 - 46 %   MCV 92.4 80.0 - 100.0 fL   MCH 32.1 26.0 - 34.0 pg   MCHC 34.8 30.0 - 36.0 g/dL   RDW 13.7 11.5 - 15.5 %   Platelets 271 150 - 400 K/uL   nRBC 0.0 0.0 - 0.2 %  Lipid panel     Status: Abnormal   Collection Time: 01/10/20  3:59 AM  Result Value Ref Range   Cholesterol 183 0 - 200 mg/dL   Triglycerides 79 <150 mg/dL   HDL 56 >40 mg/dL   Total CHOL/HDL Ratio 3.3 RATIO   VLDL 16 0 - 40 mg/dL   LDL Cholesterol 111 (H) 0 - 99 mg/dL  Basic metabolic panel     Status: Abnormal   Collection Time: 01/10/20  3:59 AM  Result Value Ref Range   Sodium 141 135 - 145 mmol/L   Potassium 3.4 (L) 3.5 - 5.1 mmol/L   Chloride 107 98 - 111 mmol/L   CO2 23 22 - 32 mmol/L   Glucose, Bld 110 (H) 70 - 99 mg/dL   BUN 9 6 - 20 mg/dL   Creatinine, Ser 0.74 0.44 - 1.00 mg/dL   Calcium 9.0 8.9 - 10.3 mg/dL   GFR calc non Af Amer >60 >60 mL/min   GFR calc Af Amer >60 >60 mL/min   Anion gap 11 5 - 15  Heparin level (unfractionated)     Status: None   Collection Time: 01/10/20  6:40 AM  Result Value Ref Range   Heparin Unfractionated 0.32 0.30 - 0.70 IU/mL   DG Chest 2 View  Result Date: 01/09/2020 CLINICAL DATA:  Central chest pain, dyspnea on exertion, low-grade temperature EXAM: CHEST - 2 VIEW COMPARISON:  None. FINDINGS: Frontal and lateral views of the chest demonstrate an  unremarkable cardiac silhouette. No acute airspace disease, effusion, or pneumothorax. No acute bony abnormalities. IMPRESSION: 1. No acute intrathoracic process. Electronically Signed   By: Randa Ngo M.D.   On: 01/09/2020 16:20   ECHOCARDIOGRAM COMPLETE  Result Date: 01/10/2020    ECHOCARDIOGRAM REPORT   Patient Name:   Valerie Braun Date of Exam: 01/09/2020 Medical Rec #:  [id number]       Height:       63.0 in Accession #:    [id number]      Weight:       170.0 lb Date of Birth:  May 07, 1965       BSA:          1.805 m Patient Age:    38 years        BP:           156/102 mmHg Patient Gender: F               HR:  75 bpm. Exam Location:  [facility] Procedure: 2D Echo Indications:     NSTEMI I21.4  History:         Patient has no prior history of Echocardiogram examinations.                  Acute MI.  Sonographer:     Avanell Shackleton Referring Phys:  [id number] Cleaster Corin PATEL Diagnosing Phys: Yolonda Kida MD IMPRESSIONS  1. Left ventricular ejection fraction, by estimation, is 55 to 60%. The left ventricle has normal function. The left ventricle has no regional wall motion abnormalities. Left ventricular diastolic parameters are consistent with Grade I diastolic dysfunction (impaired relaxation).  2. Right ventricular systolic function is normal. The right ventricular size is normal.  3. Left atrial size was mildly dilated.  4. Right atrial size was mildly dilated.  5. The mitral valve is normal in structure. Mild mitral valve regurgitation.  6. Tricuspid valve regurgitation is mild to moderate.  7. The aortic valve is normal in structure. Aortic valve regurgitation is trivial. FINDINGS  Left Ventricle: Left ventricular ejection fraction, by estimation, is 55 to 60%. The left ventricle has normal function. The left ventricle has no regional wall motion abnormalities. The left ventricular internal cavity size was normal in size. There is  no left ventricular hypertrophy. Left ventricular diastolic  parameters are consistent with Grade I diastolic dysfunction (impaired relaxation). Right Ventricle: The right ventricular size is normal. No increase in right ventricular wall thickness. Right ventricular systolic function is normal. Left Atrium: Left atrial size was mildly dilated. Right Atrium: Right atrial size was mildly dilated. Pericardium: There is no evidence of pericardial effusion. Mitral Valve: The mitral valve is normal in structure. Mild mitral valve regurgitation. Tricuspid Valve: The tricuspid valve is normal in structure. Tricuspid valve regurgitation is mild to moderate. Aortic Valve: The aortic valve is normal in structure. Aortic valve regurgitation is trivial. Pulmonic Valve: The pulmonic valve was normal in structure. Pulmonic valve regurgitation is not visualized. Aorta: The aortic root was not well visualized. IAS/Shunts: No atrial level shunt detected by color flow Doppler.  LEFT VENTRICLE PLAX 2D LVIDd:         3.74 cm      Diastology LVIDs:         2.56 cm      LV e' medial:    6.64 cm/s LV PW:         0.99 cm      LV E/e' medial:  15.2 LV IVS:        0.97 cm      LV e' lateral:   4.90 cm/s LVOT diam:     1.80 cm      LV E/e' lateral: 20.6 LVOT Area:     2.54 cm  LV Volumes (MOD) LV vol d, MOD A2C: 122.0 ml LV vol d, MOD A4C: 109.0 ml LV vol s, MOD A2C: 78.9 ml LV vol s, MOD A4C: 74.7 ml LV SV MOD A2C:     43.1 ml LV SV MOD A4C:     109.0 ml LV SV MOD BP:      38.6 ml RIGHT VENTRICLE             IVC RV S prime:     11.10 cm/s  IVC diam: 1.25 cm LEFT ATRIUM             Index       RIGHT ATRIUM  Index LA diam:        3.40 cm 1.88 cm/m  RA Area:     11.70 cm LA Vol (A2C):   49.8 ml 27.60 ml/m RA Volume:   29.90 ml  16.57 ml/m LA Vol (A4C):   38.5 ml 21.33 ml/m LA Biplane Vol: 44.1 ml 24.44 ml/m   AORTA Ao Root diam: 2.80 cm MITRAL VALVE                TRICUSPID VALVE MV Area (PHT): 2.66 cm     TR Peak grad:   25.4 mmHg MV Decel Time: 285 msec     TR Vmax:        252.00 cm/s  MV E velocity: 101.00 cm/s MV A velocity: 124.00 cm/s  SHUNTS MV E/A ratio:  0.81         Systemic Diam: 1.80 cm Dwayne Prince Rome MD Electronically signed by Yolonda Kida MD Signature Date/Time: 01/10/2020/7:58:25 AM    Final      ASSESSMENT AND PLAN: Patient presented to the emergency department with NSTEMI.  Patient already loaded with Brilinta and started on a heparin drip, aspirin, and high intensity statin.  We will plan on cardiac catheterization today.  Please keep n.p.o.. Echocardiogram revealed ejection fraction 55 to 60% with grade 1 diastolic dysfunction, mild MR, and mild to moderate TR.  Adaline Sill NP-C

## 2020-01-10 NOTE — Consult Note (Signed)
[place] for Heparin  Indication: ACS / STEMI  Allergies  Allergen Reactions  . Other Itching    Red Kool Aid    Patient Measurements: Height: 5\' 3"  (160 cm) Weight: 77.1 kg (170 lb) IBW/kg (Calculated) : 52.4 Heparin Dosing Weight: 69 kg   Vital Signs: Temp: 98.6 F (37 C) (09/20 1713) Temp Source: Oral (09/20 1713) BP: 151/85 (09/21 0000) Pulse Rate: 88 (09/21 0000)  Labs: Recent Labs    01/09/20 1550 01/09/20 1800 01/09/20 1805 01/09/20 2226 01/10/20 0046  HGB 15.6*  --   --   --   --   HCT 44.9  --   --   --   --   PLT 312  --   --   --   --   APTT  --   --  31  --   --   LABPROT  --   --  12.2  --   --   INR  --   --  0.9  --   --   HEPARINUNFRC  --   --   --   --  0.35  CREATININE 0.55  --   --   --   --   TROPONINIHS 606* 1,904*  --  3,856*  --     Estimated Creatinine Clearance: 79.1 mL/min (by C-G formula based on SCr of 0.55 mg/dL).   Medications:  Confirmed no PTA anticoagulant  Assessment: Pharmacy consulted for heparin dosing in a patient with ACS. Baseline H/H and platelet are appropriate.   Trop 606  Goal of Therapy:  Heparin level 0.3-0.7 units/ml Monitor platelets by anticoagulation protocol: Yes   Plan:  Baseline labs have been ordered  Heparin DW: 69 kg  Give 4000 units bolus x 1 Start heparin infusion at 800 units/hr Check anti-Xa level in 6 hours and daily while on heparin, per protocol Continue to monitor H&H and platelets  9/21 : HL @ 0046 = 0.35 Will continue pt on current rate and recheck HL in 6 hrs on 9/21 @ 0700.    Benjerman Molinelli D 01/10/2020,1:37 AM

## 2020-01-11 ENCOUNTER — Encounter: Payer: Self-pay | Admitting: Cardiovascular Disease

## 2020-01-11 LAB — CBC
HCT: 42.8 % (ref 36.0–46.0)
Hemoglobin: 15.2 g/dL — ABNORMAL HIGH (ref 12.0–15.0)
MCH: 32.4 pg (ref 26.0–34.0)
MCHC: 35.5 g/dL (ref 30.0–36.0)
MCV: 91.3 fL (ref 80.0–100.0)
Platelets: 278 10*3/uL (ref 150–400)
RBC: 4.69 MIL/uL (ref 3.87–5.11)
RDW: 13.3 % (ref 11.5–15.5)
WBC: 10.2 10*3/uL (ref 4.0–10.5)
nRBC: 0 % (ref 0.0–0.2)

## 2020-01-11 LAB — BASIC METABOLIC PANEL
Anion gap: 10 (ref 5–15)
BUN: 10 mg/dL (ref 6–20)
CO2: 23 mmol/L (ref 22–32)
Calcium: 9.6 mg/dL (ref 8.9–10.3)
Chloride: 104 mmol/L (ref 98–111)
Creatinine, Ser: 0.57 mg/dL (ref 0.44–1.00)
GFR calc Af Amer: >60 mL/min (ref >60)
GFR calc non Af Amer: >60 mL/min (ref >60)
Glucose, Bld: 102 mg/dL — ABNORMAL HIGH (ref 70–99)
Potassium: 3.7 mmol/L (ref 3.5–5.1)
Sodium: 137 mmol/L (ref 135–145)

## 2020-01-11 LAB — MAGNESIUM: Magnesium: 2.1 mg/dL (ref 1.7–2.4)

## 2020-01-11 MED ORDER — PANTOPRAZOLE SODIUM 40 MG PO TBEC
40.0000 mg | DELAYED_RELEASE_TABLET | Freq: Every day | ORAL | Status: DC
  Administered 2020-01-11 – 2020-01-12 (×2): 40 mg via ORAL
  Filled 2020-01-11 (×2): qty 1

## 2020-01-11 MED ORDER — LINACLOTIDE 145 MCG PO CAPS
145.0000 ug | ORAL_CAPSULE | Freq: Every day | ORAL | Status: DC
  Administered 2020-01-11 – 2020-01-12 (×2): 145 ug via ORAL
  Filled 2020-01-11 (×2): qty 1

## 2020-01-11 MED ORDER — SPIRONOLACTONE 25 MG PO TABS
25.0000 mg | ORAL_TABLET | Freq: Every day | ORAL | Status: DC
  Administered 2020-01-11 – 2020-01-12 (×2): 25 mg via ORAL
  Filled 2020-01-11 (×2): qty 1

## 2020-01-11 NOTE — Hospital Course (Addendum)
OLIVEA SONNEN is a 54 y.o. female with medical history significant for IBS and GERD who presented to the ED on 01/09/20 for evaluation of chest pain.  Onset was on 9/18, described as tightness/pressure, she initially attributed to GERD symptoms.   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    Evaluation in the ED showed uncontrolled BP 165/88.  ECG normal sinus rhythm with nonspecific ST-T changes and LVH.  Troponin 1904>>3856.  Treated with full dose ASA, SL nitroglycerin, K replaced.  Cardiology was consulted.    Patient was loaded with Brilinta, started on heparin infusion.  Admitted to hospitalist service.  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  Considered high risk for angioplasty.  Medical therapy was recommended with Entresto, Aldactone, beta blocker, high intensity statin, and DAPT with ASA and Brilinta.

## 2020-01-11 NOTE — Progress Notes (Signed)
PROGRESS NOTE    KAMARAH BILOTTA   NIO:[id number]  DOB: 07/09/1965  PCP: Marinda Elk, MD    DOA: 01/09/2020 LOS: 2   Brief Narrative   Valerie Braun is a 54 y.o. female with medical history significant for IBS and GERD who presented to the ED on 01/09/20 for evaluation of chest pain.  Onset was on 9/18, described as tightness/pressure, she initially attributed to GERD symptoms.   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    Evaluation in the ED showed uncontrolled BP 165/88.  ECG normal sinus rhythm with nonspecific ST-T changes and LVH.  Troponin 1904>>3856.  Treated with full dose ASA, SL nitroglycerin, K replaced.  Cardiology was consulted.    Patient was loaded with Brilinta, started on heparin infusion.  Admitted to hospitalist service.  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  Considered high risk for angioplasty.  Medical therapy was recommended with Entresto, Aldactone, high intensity statin, and DAPT with ASA and Brilinta.     Assessment & Plan   Principal Problem:   NSTEMI (non-ST elevation myocardial infarction) ([place]) Active Problems:   Hypokalemia   Non-STEMI - present on admission with typical ACS symptoms, elevated and rising hs-troponin 1606>>1904>>3856. EKG without ST segment elevation but non-specific ST-T changes and later T wave inversions on repeat.  Treated with sublingual nitro with relief of symptoms in the ED.  Cath 9/21 with CTO of LAD, not amenable to PCI.   --Cardiology following --Completed heparin infusion --Continue ASA and Brilinta for DAPT, high-intensity statin --Continue Entresto --Started Aldactone --Telemetry --If active chest pain - stat ECG and troponin, PRN SL nitro  Diastolic dysfunction - Echo 9/20 with EF 55-60%, grade I  diastolic dysfunction, mild MR, mild-mod TR.    Hypokalemia - K 3.2 on admission, replaced.  Monitor and replace as needed.   Hx of IBS - no acute issues.  Continue Linzess.  Monitor.   GERD - chronic.  Continue PPI.     Obesity: Body mass index is 30.13 kg/m.  Complicates overall care and prognosis.  Counseled on lifestyle modifications including diet and exercise.  DVT prophylaxis:    Diet:  Diet Orders (From admission, onward)    Start     Ordered   01/10/20 1537  Diet regular Room service appropriate? Yes; Fluid consistency: Thin  Diet effective now       Question Answer Comment  Room service appropriate? Yes   Fluid consistency: Thin      01/10/20 1536            Code Status: Full Code    Subjective 01/11/20    Patient seen this AM.  She denies any chest pain recurrence since admission.  Slightly short of breath when up ambulating.  Feels better overall.  No issues with groin site after cath.   Disposition Plan & Communication   Status is: Inpatient  Remains inpatient appropriate because:IV treatments appropriate due to intensity of illness or inability to take PO. Patient with NSTEMI on new medications, requires close monitoring another 24 hours prior to safe d/c home, per cardiology.   Dispo: The patient is from: Home              Anticipated d/c is to: Home              Anticipated d/c date  is: 1 day              Patient currently is not medically stable to d/c.   Family Communication: at bedside during encounter and updated today, 9/22    Consults, Procedures, Significant Events   Consultants:   Cardiology  Procedures:   Echo 01/09/20  Cardiac cath 01/10/20  Antimicrobials:  Anti-infectives (From admission, onward)   None        Objective   Vitals:   01/10/20 1704 01/10/20 1715 01/10/20 1904 01/11/20 0452  BP: (!) 145/103  (!) 144/98 114/82  Pulse: 85  86 79  Resp: 18  18 18   Temp: 98.2 F (36.8 C)  99.3 F (37.4 C) 98.3 F  (36.8 C)  TempSrc: Oral  Oral Oral  SpO2: 100%  100% 97%  Weight:  77.2 kg    Height:  5\' 3"  (1.6 m)      Intake/Output Summary (Last 24 hours) at 01/11/2020 0821 Last data filed at 01/11/2020 0029 Gross per 24 hour  Intake 3 ml  Output 1300 ml  Net -1297 ml   Filed Weights   01/09/20 1548 01/10/20 1421 01/10/20 1715  Weight: 77.1 kg 77.1 kg 77.2 kg    Physical Exam:  General exam: awake, alert, no acute distress Respiratory system: CTAB, normal respiratory effort. Cardiovascular system: normal S1/S2, RRR, no pedal edema.   Gastrointestinal system: soft, NT, ND, no HSM felt, +bowel sounds. Central nervous system: A&O x4. no gross focal neurologic deficits, normal speech Extremities: moves all, no edema, normal tone Psychiatry: normal mood, congruent affect, judgement and insight appear normal  Labs   Data Reviewed: I have personally reviewed following labs and imaging studies  CBC: Recent Labs  Lab 01/09/20 1550 01/10/20 0359 01/10/20 1209 01/11/20 0601  WBC 15.0* 9.6 9.6 10.2  HGB 15.6* 14.4 14.7 15.2*  HCT 44.9 41.4 43.6 42.8  MCV 91.1 92.4 93.8 91.3  PLT 312 271 273 130   Basic Metabolic Panel: Recent Labs  Lab 01/09/20 1550 01/09/20 2226 01/10/20 0359 01/10/20 1209 01/11/20 0601  NA 144  --  141 140 137  K 3.2*  --  3.4* 4.0 3.7  CL 106  --  107 104 104  CO2 27  --  23 26 23   GLUCOSE 141*  --  110* 96 102*  BUN 9  --  9 8 10   CREATININE 0.55  --  0.74 0.64 0.57  CALCIUM 9.4  --  9.0 9.3 9.6  MG  --  2.2  --   --  2.1   GFR: Estimated Creatinine Clearance: 79.1 mL/min (by C-G formula based on SCr of 0.57 mg/dL). Liver Function Tests: No results for input(s): AST, ALT, ALKPHOS, BILITOT, PROT, ALBUMIN in the last 168 hours. No results for input(s): LIPASE, AMYLASE in the last 168 hours. No results for input(s): AMMONIA in the last 168 hours. Coagulation Profile: Recent Labs  Lab 01/09/20 1805 01/10/20 1209  INR 0.9 1.0   Cardiac Enzymes: No  results for input(s): CKTOTAL, CKMB, CKMBINDEX, TROPONINI in the last 168 hours. BNP (last 3 results) No results for input(s): PROBNP in the last 8760 hours. HbA1C: Recent Labs    01/10/20 0359  HGBA1C 5.5   CBG: No results for input(s): GLUCAP in the last 168 hours. Lipid Profile: Recent Labs    01/10/20 0359  CHOL 183  HDL 56  LDLCALC 111*  TRIG 79  CHOLHDL 3.3   Thyroid Function Tests: No results for input(s): TSH, T4TOTAL, FREET4,  T3FREE, THYROIDAB in the last 72 hours. Anemia Panel: No results for input(s): VITAMINB12, FOLATE, FERRITIN, TIBC, IRON, RETICCTPCT in the last 72 hours. Sepsis Labs: No results for input(s): PROCALCITON, LATICACIDVEN in the last 168 hours.  Recent Results (from the past 240 hour(s))  SARS Coronavirus 2 by RT PCR (hospital order, performed in [facility] hospital lab) Nasopharyngeal Nasopharyngeal Swab     Status: None   Collection Time: 01/09/20  5:16 PM   Specimen: Nasopharyngeal Swab  Result Value Ref Range Status   SARS Coronavirus 2 NEGATIVE NEGATIVE Final    Comment: (NOTE) SARS-CoV-2 target nucleic acids are NOT DETECTED.  The SARS-CoV-2 RNA is generally detectable in upper and lower respiratory specimens during the acute phase of infection. The lowest concentration of SARS-CoV-2 viral copies this assay can detect is 250 copies / mL. A negative result does not preclude SARS-CoV-2 infection and should not be used as the sole basis for treatment or other patient management decisions.  A negative result may occur with improper specimen collection / handling, submission of specimen other than nasopharyngeal swab, presence of viral mutation(s) within the areas targeted by this assay, and inadequate number of viral copies (<250 copies / mL). A negative result must be combined with clinical observations, patient history, and epidemiological information.  Fact Sheet for Patients:   StrictlyIdeas.no  Fact Sheet  for Healthcare Providers: BankingDealers.co.za  This test is not yet approved or  cleared by the [place] FDA and has been authorized for detection and/or diagnosis of SARS-CoV-2 by FDA under an Emergency Use Authorization (EUA).  This EUA will remain in effect (meaning this test can be used) for the duration of the COVID-19 declaration under Section 564(b)(1) of the Act, 21 U.S.C. section 360bbb-3(b)(1), unless the authorization is terminated or revoked sooner.  Performed at [facility], [address] [place], [place] [address]       Imaging Studies   DG Chest 2 View  Result Date: 01/09/2020 CLINICAL DATA:  Central chest pain, dyspnea on exertion, low-grade temperature EXAM: CHEST - 2 VIEW COMPARISON:  None. FINDINGS: Frontal and lateral views of the chest demonstrate an unremarkable cardiac silhouette. No acute airspace disease, effusion, or pneumothorax. No acute bony abnormalities. IMPRESSION: 1. No acute intrathoracic process. Electronically Signed   By: Randa Ngo M.D.   On: 01/09/2020 16:20   CARDIAC CATHETERIZATION  Result Date: 01/10/2020  Dist LAD lesion is 100% stenosed.  2nd Diag lesion is 100% stenosed.    ECHOCARDIOGRAM COMPLETE  Result Date: 01/10/2020    ECHOCARDIOGRAM REPORT   Patient Name:   SILVA AAMODT Date of Exam: 01/09/2020 Medical Rec #:  [id number]       Height:       63.0 in Accession #:    [id number]      Weight:       170.0 lb Date of Birth:  March 13, 1966       BSA:          1.805 m Patient Age:    27 years        BP:           156/102 mmHg Patient Gender: F               HR:           75 bpm. Exam Location:  [facility] Procedure: 2D Echo Indications:     NSTEMI I21.4  History:         Patient has no prior  history of Echocardiogram examinations.                  Acute MI.  Sonographer:     Avanell Shackleton Referring Phys:  [id number] Cleaster Corin PATEL Diagnosing Phys: Yolonda Kida MD IMPRESSIONS  1. Left ventricular  ejection fraction, by estimation, is 55 to 60%. The left ventricle has normal function. The left ventricle has no regional wall motion abnormalities. Left ventricular diastolic parameters are consistent with Grade I diastolic dysfunction (impaired relaxation).  2. Right ventricular systolic function is normal. The right ventricular size is normal.  3. Left atrial size was mildly dilated.  4. Right atrial size was mildly dilated.  5. The mitral valve is normal in structure. Mild mitral valve regurgitation.  6. Tricuspid valve regurgitation is mild to moderate.  7. The aortic valve is normal in structure. Aortic valve regurgitation is trivial. FINDINGS  Left Ventricle: Left ventricular ejection fraction, by estimation, is 55 to 60%. The left ventricle has normal function. The left ventricle has no regional wall motion abnormalities. The left ventricular internal cavity size was normal in size. There is  no left ventricular hypertrophy. Left ventricular diastolic parameters are consistent with Grade I diastolic dysfunction (impaired relaxation). Right Ventricle: The right ventricular size is normal. No increase in right ventricular wall thickness. Right ventricular systolic function is normal. Left Atrium: Left atrial size was mildly dilated. Right Atrium: Right atrial size was mildly dilated. Pericardium: There is no evidence of pericardial effusion. Mitral Valve: The mitral valve is normal in structure. Mild mitral valve regurgitation. Tricuspid Valve: The tricuspid valve is normal in structure. Tricuspid valve regurgitation is mild to moderate. Aortic Valve: The aortic valve is normal in structure. Aortic valve regurgitation is trivial. Pulmonic Valve: The pulmonic valve was normal in structure. Pulmonic valve regurgitation is not visualized. Aorta: The aortic root was not well visualized. IAS/Shunts: No atrial level shunt detected by color flow Doppler.  LEFT VENTRICLE PLAX 2D LVIDd:         3.74 cm      Diastology  LVIDs:         2.56 cm      LV e' medial:    6.64 cm/s LV PW:         0.99 cm      LV E/e' medial:  15.2 LV IVS:        0.97 cm      LV e' lateral:   4.90 cm/s LVOT diam:     1.80 cm      LV E/e' lateral: 20.6 LVOT Area:     2.54 cm  LV Volumes (MOD) LV vol d, MOD A2C: 122.0 ml LV vol d, MOD A4C: 109.0 ml LV vol s, MOD A2C: 78.9 ml LV vol s, MOD A4C: 74.7 ml LV SV MOD A2C:     43.1 ml LV SV MOD A4C:     109.0 ml LV SV MOD BP:      38.6 ml RIGHT VENTRICLE             IVC RV S prime:     11.10 cm/s  IVC diam: 1.25 cm LEFT ATRIUM             Index       RIGHT ATRIUM           Index LA diam:        3.40 cm 1.88 cm/m  RA Area:     11.70 cm LA Vol (A2C):  49.8 ml 27.60 ml/m RA Volume:   29.90 ml  16.57 ml/m LA Vol (A4C):   38.5 ml 21.33 ml/m LA Biplane Vol: 44.1 ml 24.44 ml/m   AORTA Ao Root diam: 2.80 cm MITRAL VALVE                TRICUSPID VALVE MV Area (PHT): 2.66 cm     TR Peak grad:   25.4 mmHg MV Decel Time: 285 msec     TR Vmax:        252.00 cm/s MV E velocity: 101.00 cm/s MV A velocity: 124.00 cm/s  SHUNTS MV E/A ratio:  0.81         Systemic Diam: 1.80 cm Dwayne Prince Rome MD Electronically signed by Yolonda Kida MD Signature Date/Time: 01/10/2020/7:58:25 AM    Final      Medications   Scheduled Meds: . aspirin EC  81 mg Oral Daily  . atorvastatin  80 mg Oral QHS  . metoprolol succinate  100 mg Oral Daily  . sacubitril-valsartan  1 tablet Oral BID  . sodium chloride flush  3 mL Intravenous Q12H  . sodium chloride flush  3 mL Intravenous Q12H  . ticagrelor  90 mg Oral BID   Continuous Infusions: . sodium chloride         LOS: 2 days    Time spent: 30 minutes    Ezekiel Slocumb, DO [facility]  01/11/2020, 8:21 AM    If 7PM-7AM, please contact night-coverage. How to contact the [facility] Attending or Consulting provider [place] or covering provider during after hours [place], for this patient?    1. Check the care team in [facility] and look for a) attending/consulting  TR[facility] provider listed and b) the [facility] team listed 2. Log into [email or web address] and use [place]'s universal password to access. If you do not have the password, please contact the hospital operator. 3. Locate the [facility] provider you are looking for under [facility] and page to a number that you can be directly reached. 4. If you still have difficulty reaching the provider, please page the [facility] (Director on Call) for the Hospitalists listed on amion for assistance.

## 2020-01-11 NOTE — Progress Notes (Addendum)
SUBJECTIVE: Patient resting comfortably. Denies chest pain or shortness of breath.   Vitals:   01/10/20 1715 01/10/20 1904 01/11/20 0452 01/11/20 0827  BP:  (!) 144/98 114/82 (!) 139/92  Pulse:  86 79 70  Resp:  18 18 18   Temp:  99.3 F (37.4 C) 98.3 F (36.8 C) 98 F (36.7 C)  TempSrc:  Oral Oral Oral  SpO2:  100% 97% 99%  Weight: 77.2 kg     Height: 5\' 3"  (1.6 m)       Intake/Output Summary (Last 24 hours) at 01/11/2020 1043 Last data filed at 01/11/2020 0029 Gross per 24 hour  Intake 3 ml  Output 1300 ml  Net -1297 ml    LABS: Basic Metabolic Panel: Recent Labs    01/09/20 2226 01/10/20 0359 01/10/20 1209 01/11/20 0601  NA  --    < > 140 137  K  --    < > 4.0 3.7  CL  --    < > 104 104  CO2  --    < > 26 23  GLUCOSE  --    < > 96 102*  BUN  --    < > 8 10  CREATININE  --    < > 0.64 0.57  CALCIUM  --    < > 9.3 9.6  MG 2.2  --   --  2.1   < > = values in this interval not displayed.   Liver Function Tests: No results for input(s): AST, ALT, ALKPHOS, BILITOT, PROT, ALBUMIN in the last 72 hours. No results for input(s): LIPASE, AMYLASE in the last 72 hours. CBC: Recent Labs    01/10/20 1209 01/11/20 0601  WBC 9.6 10.2  HGB 14.7 15.2*  HCT 43.6 42.8  MCV 93.8 91.3  PLT 273 278   Cardiac Enzymes: No results for input(s): CKTOTAL, CKMB, CKMBINDEX, TROPONINI in the last 72 hours. BNP: Invalid input(s): POCBNP D-Dimer: No results for input(s): DDIMER in the last 72 hours. Hemoglobin A1C: Recent Labs    01/10/20 0359  HGBA1C 5.5   Fasting Lipid Panel: Recent Labs    01/10/20 0359  CHOL 183  HDL 56  LDLCALC 111*  TRIG 79  CHOLHDL 3.3   Thyroid Function Tests: No results for input(s): TSH, T4TOTAL, T3FREE, THYROIDAB in the last 72 hours.  Invalid input(s): FREET3 Anemia Panel: No results for input(s): VITAMINB12, FOLATE, FERRITIN, TIBC, IRON, RETICCTPCT in the last 72 hours.   PHYSICAL EXAM General: Well developed, well nourished, in no  acute distress HEENT:  Normocephalic and atramatic Neck:  No JVD.  Lungs: Clear bilaterally to auscultation and percussion. Heart: HRRR . Normal S1 and S2 without gallops or murmurs.  Abdomen: Bowel sounds are positive, abdomen soft and non-tender  Msk:  Back normal, normal gait. Normal strength and tone for age. Extremities: No clubbing, cyanosis or edema.   Neuro: Alert and oriented X 3. Psych:  Good affect, responds appropriately  TELEMETRY: NSR 70s  ASSESSMENT AND PLAN: Patient has occluded distal LAD and distal diagonal which appears to be a CTO. Will have films reviewed by [facility] but appears that the patient has developed collaterals already and would be high risk to do angioplasty. Severe hypokinesis of the anteroapical wall seen. Entresto started yesterday and will start spironolactone today as eplerenone is not on formulary and will be transitioned to it in the outpatient setting.  With CAD and chronic occlusions, continue aspirin, Brilinta, and high-dose statin. If patient remains asymptomatic, can likely be  discharged tomorrow with follow up at my office 9/24 or 9/27    Principal Problem:   NSTEMI (non-ST elevation myocardial infarction) [facility]) Active Problems:   Hypokalemia    Valerie Sill, NP-C 01/11/2020 10:43 AM

## 2020-01-12 LAB — CBC
HCT: 41.7 % (ref 36.0–46.0)
Hemoglobin: 14.7 g/dL (ref 12.0–15.0)
MCH: 31.9 pg (ref 26.0–34.0)
MCHC: 35.3 g/dL (ref 30.0–36.0)
MCV: 90.5 fL (ref 80.0–100.0)
Platelets: 280 10*3/uL (ref 150–400)
RBC: 4.61 MIL/uL (ref 3.87–5.11)
RDW: 13.2 % (ref 11.5–15.5)
WBC: 10.4 10*3/uL (ref 4.0–10.5)
nRBC: 0 % (ref 0.0–0.2)

## 2020-01-12 LAB — BASIC METABOLIC PANEL
Anion gap: 7 (ref 5–15)
BUN: 13 mg/dL (ref 6–20)
CO2: 26 mmol/L (ref 22–32)
Calcium: 9.1 mg/dL (ref 8.9–10.3)
Chloride: 106 mmol/L (ref 98–111)
Creatinine, Ser: 0.68 mg/dL (ref 0.44–1.00)
GFR calc Af Amer: >60 mL/min (ref >60)
GFR calc non Af Amer: >60 mL/min (ref >60)
Glucose, Bld: 115 mg/dL — ABNORMAL HIGH (ref 70–99)
Potassium: 3.6 mmol/L (ref 3.5–5.1)
Sodium: 139 mmol/L (ref 135–145)

## 2020-01-12 LAB — MAGNESIUM: Magnesium: 2.1 mg/dL (ref 1.7–2.4)

## 2020-01-12 MED ORDER — SPIRONOLACTONE 25 MG PO TABS: 25.0000 mg | ORAL_TABLET | Freq: Every day | ORAL | 2 refills | Status: AC

## 2020-01-12 MED ORDER — ASPIRIN 81 MG PO TBEC: 81.0000 mg | DELAYED_RELEASE_TABLET | Freq: Every day | ORAL | 11 refills | Status: AC

## 2020-01-12 MED ORDER — SACUBITRIL-VALSARTAN 24-26 MG PO TABS: 1.0000 | ORAL_TABLET | Freq: Two times a day (BID) | ORAL | 2 refills | Status: DC

## 2020-01-12 MED ORDER — NITROGLYCERIN 0.4 MG SL SUBL: 0.4000 mg | SUBLINGUAL_TABLET | SUBLINGUAL | 1 refills | Status: DC | PRN

## 2020-01-12 MED ORDER — TICAGRELOR 90 MG PO TABS
90.0000 mg | ORAL_TABLET | Freq: Two times a day (BID) | ORAL | 2 refills | Status: DC
Start: 2020-01-12 — End: 2021-09-03

## 2020-01-12 MED ORDER — ATORVASTATIN CALCIUM 80 MG PO TABS
80.0000 mg | ORAL_TABLET | Freq: Every day | ORAL | 2 refills | Status: DC
Start: 2020-01-12 — End: 2021-09-03

## 2020-01-12 MED ORDER — METOPROLOL SUCCINATE ER 100 MG PO TB24
100.0000 mg | ORAL_TABLET | Freq: Every day | ORAL | 2 refills | Status: DC
Start: 2020-01-13 — End: 2021-09-03

## 2020-01-12 MED ORDER — GUAIFENESIN-DM 100-10 MG/5ML PO SYRP
5.0000 mL | ORAL_SOLUTION | ORAL | Status: DC | PRN
  Administered 2020-01-12: 5 mL via ORAL
  Filled 2020-01-12: qty 5

## 2020-01-12 NOTE — Discharge Summary (Addendum)
Physician Discharge Summary  LACI FRENKEL OZD:[id number] DOB: 06-Jul-1965 DOA: 01/09/2020  PCP: Marinda Elk, MD  Admit date: 01/09/2020 Discharge date: 01/12/2020  Admitted From: home Disposition:  home  Recommendations for Outpatient Follow-up:  1. Follow up with PCP in 1-2 weeks 2. Please obtain BMP/CBC in one week 3. Please follow up with Dr. Clayborn Bigness next week as scheduled  Home Health: No  Equipment/Devices: None   Discharge Condition: Stable  CODE STATUS: Full Diet recommendation: Heart Healthy    Discharge Diagnoses: Principal Problem:   NSTEMI (non-ST elevation myocardial infarction) [facility]) Active Problems:   Hypokalemia    Summary of HPI and Hospital Course:  Valerie Braun is a 54 y.o. female with medical history significant for IBS and GERD who presented to the ED on 01/09/20 for evaluation of chest pain.  Onset was on 9/18, described as tightness/pressure, she initially attributed to GERD symptoms.   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    Evaluation in the ED showed uncontrolled BP 165/88.  ECG normal sinus rhythm with nonspecific ST-T changes and LVH.  Troponin 1904>>3856.  Treated with full dose ASA, SL nitroglycerin, K replaced.  Cardiology was consulted.    Patient was loaded with Brilinta, started on heparin infusion.  Admitted to hospitalist service.  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  Considered high risk for angioplasty.  Medical therapy was recommended with Entresto, Aldactone, beta blocker, high intensity statin, and DAPT with ASA and Brilinta.    Non-STEMI - present on admission with typical ACS symptoms, elevated and rising hs-troponin 1606>>1904>>3856. EKG without ST segment elevation but non-specific ST-T changes and later T wave  inversions on repeat.   Treated with sublingual nitro with relief of symptoms in the ED.   Cardiac Cath 9/21 by Dr. Humphrey Rolls showed CTO of LAD, not amenable to PCI.   --Completed heparin infusion --Continue ASA and Brilinta for DAPT, high-intensity statin, Entresto, Aldactone  Diastolic dysfunction - Echo 9/20 with EF 25-95%, grade I diastolic dysfunction, mild MR, mild-mod TR.    Hypokalemia - K 3.2 on admission, replaced.  Repeat BMP in follow up.   Hx of IBS - no acute issues.  Continue Linzess.  Monitor.   GERD - chronic.  Continue PPI.   Obesity: Body mass index is 30.13 kg/m.  Complicates overall care and prognosis.  Counseled on lifestyle modifications including diet and exercise.   Discharge Instructions   Discharge Instructions    AMB Referral to Cardiac Rehabilitation - Phase II   Complete by: As directed    Diagnosis: NSTEMI   After initial evaluation and assessments completed: Virtual Based Care may be provided alone or in conjunction with Phase 2 Cardiac Rehab based on patient barriers.: No   Call MD for:   Complete by: As directed    Recurrent chest pain.   Worsening fatigue, general weakness, or shortness of breath (esp with activity / exertion).   Call MD for:  extreme fatigue   Complete by: As directed    Call MD for:  persistant dizziness or light-headedness   Complete by: As directed    Call MD for:  severe uncontrolled pain   Complete by: As directed    Diet - low sodium heart healthy   Complete by: As directed    Increase activity slowly   Complete by: As directed  Allergies as of 01/12/2020      Reactions   Other Itching   Red Kool Aid      Medication List    TAKE these medications   aspirin 81 MG EC tablet Take 1 tablet (81 mg total) by mouth daily. Swallow whole. Start taking on: January 13, 2020   atorvastatin 80 MG tablet Commonly known as: LIPITOR Take 1 tablet (80 mg total) by mouth at bedtime.   calcium-vitamin D 500-200  MG-UNIT tablet Take 1 tablet by mouth daily.   esomeprazole 40 MG capsule Commonly known as: NEXIUM 1 capsule once daily.   Fish Oil 1000 MG Caps Take by mouth.   ibuprofen 200 MG tablet Commonly known as: ADVIL Take 200 mg by mouth every 6 (six) hours as needed.   Linzess 145 MCG Caps capsule Generic drug: linaclotide Take by mouth.   metoprolol succinate 100 MG 24 hr tablet Commonly known as: TOPROL-XL Take 1 tablet (100 mg total) by mouth daily. Take with or immediately following a meal. Start taking on: January 13, 2020   Multi-Vitamins Tabs Take 1 tablet by mouth daily.   nitroGLYCERIN 0.4 MG SL tablet Commonly known as: NITROSTAT Place 1 tablet (0.4 mg total) under the tongue every 5 (five) minutes as needed for chest pain.   sacubitril-valsartan 24-26 MG Commonly known as: ENTRESTO Take 1 tablet by mouth 2 (two) times daily.   spironolactone 25 MG tablet Commonly known as: ALDACTONE Take 1 tablet (25 mg total) by mouth daily. Start taking on: January 13, 2020   ticagrelor 90 MG Tabs tablet Commonly known as: BRILINTA Take 1 tablet (90 mg total) by mouth 2 (two) times daily.       Allergies  Allergen Reactions  . Other Itching    Red Kool Aid    Consultations:  Cardiology, Dr. Humphrey Rolls    Procedures/Studies: DG Chest 2 View  Result Date: 01/09/2020 CLINICAL DATA:  Central chest pain, dyspnea on exertion, low-grade temperature EXAM: CHEST - 2 VIEW COMPARISON:  None. FINDINGS: Frontal and lateral views of the chest demonstrate an unremarkable cardiac silhouette. No acute airspace disease, effusion, or pneumothorax. No acute bony abnormalities. IMPRESSION: 1. No acute intrathoracic process. Electronically Signed   By: Randa Ngo M.D.   On: 01/09/2020 16:20   CARDIAC CATHETERIZATION  Result Date: 01/10/2020  Dist LAD lesion is 100% stenosed.  2nd Diag lesion is 100% stenosed.    ECHOCARDIOGRAM COMPLETE  Result Date: 01/10/2020     ECHOCARDIOGRAM REPORT   Patient Name:   Valerie Braun Date of Exam: 01/09/2020 Medical Rec #:  [id number]       Height:       63.0 in Accession #:    [id number]      Weight:       170.0 lb Date of Birth:  December 08, 1965       BSA:          1.805 m Patient Age:    54 years        BP:           156/102 mmHg Patient Gender: F               HR:           75 bpm. Exam Location:  [facility] Procedure: 2D Echo Indications:     NSTEMI I21.4  History:         Patient has no prior history of Echocardiogram examinations.  Acute MI.  Sonographer:     Avanell Shackleton Referring Phys:  [id number] Cleaster Corin PATEL Diagnosing Phys: Yolonda Kida MD IMPRESSIONS  1. Left ventricular ejection fraction, by estimation, is 55 to 60%. The left ventricle has normal function. The left ventricle has no regional wall motion abnormalities. Left ventricular diastolic parameters are consistent with Grade I diastolic dysfunction (impaired relaxation).  2. Right ventricular systolic function is normal. The right ventricular size is normal.  3. Left atrial size was mildly dilated.  4. Right atrial size was mildly dilated.  5. The mitral valve is normal in structure. Mild mitral valve regurgitation.  6. Tricuspid valve regurgitation is mild to moderate.  7. The aortic valve is normal in structure. Aortic valve regurgitation is trivial. FINDINGS  Left Ventricle: Left ventricular ejection fraction, by estimation, is 55 to 60%. The left ventricle has normal function. The left ventricle has no regional wall motion abnormalities. The left ventricular internal cavity size was normal in size. There is  no left ventricular hypertrophy. Left ventricular diastolic parameters are consistent with Grade I diastolic dysfunction (impaired relaxation). Right Ventricle: The right ventricular size is normal. No increase in right ventricular wall thickness. Right ventricular systolic function is normal. Left Atrium: Left atrial size was mildly dilated. Right  Atrium: Right atrial size was mildly dilated. Pericardium: There is no evidence of pericardial effusion. Mitral Valve: The mitral valve is normal in structure. Mild mitral valve regurgitation. Tricuspid Valve: The tricuspid valve is normal in structure. Tricuspid valve regurgitation is mild to moderate. Aortic Valve: The aortic valve is normal in structure. Aortic valve regurgitation is trivial. Pulmonic Valve: The pulmonic valve was normal in structure. Pulmonic valve regurgitation is not visualized. Aorta: The aortic root was not well visualized. IAS/Shunts: No atrial level shunt detected by color flow Doppler.  LEFT VENTRICLE PLAX 2D LVIDd:         3.74 cm      Diastology LVIDs:         2.56 cm      LV e' medial:    6.64 cm/s LV PW:         0.99 cm      LV E/e' medial:  15.2 LV IVS:        0.97 cm      LV e' lateral:   4.90 cm/s LVOT diam:     1.80 cm      LV E/e' lateral: 20.6 LVOT Area:     2.54 cm  LV Volumes (MOD) LV vol d, MOD A2C: 122.0 ml LV vol d, MOD A4C: 109.0 ml LV vol s, MOD A2C: 78.9 ml LV vol s, MOD A4C: 74.7 ml LV SV MOD A2C:     43.1 ml LV SV MOD A4C:     109.0 ml LV SV MOD BP:      38.6 ml RIGHT VENTRICLE             IVC RV S prime:     11.10 cm/s  IVC diam: 1.25 cm LEFT ATRIUM             Index       RIGHT ATRIUM           Index LA diam:        3.40 cm 1.88 cm/m  RA Area:     11.70 cm LA Vol (A2C):   49.8 ml 27.60 ml/m RA Volume:   29.90 ml  16.57 ml/m LA Vol (A4C):   38.5 ml  21.33 ml/m LA Biplane Vol: 44.1 ml 24.44 ml/m   AORTA Ao Root diam: 2.80 cm MITRAL VALVE                TRICUSPID VALVE MV Area (PHT): 2.66 cm     TR Peak grad:   25.4 mmHg MV Decel Time: 285 msec     TR Vmax:        252.00 cm/s MV E velocity: 101.00 cm/s MV A velocity: 124.00 cm/s  SHUNTS MV E/A ratio:  0.81         Systemic Diam: 1.80 cm Dwayne Prince Rome MD Electronically signed by Yolonda Kida MD Signature Date/Time: 01/10/2020/7:58:25 AM    Final        Subjective: Patient seen at bedside this AM.   Reports cardiologist came in earlier and asked her why she was still here.  He had told her sending cath images to [facility], but per patient, did not seem to recall this.  Patient denies any chest pain.  No issues with femoral site from cath.  Ready to go home.  Patient reported she will be following up with Dr. Clayborn Bigness, whom her mother sees, and has already made an appointment with him for next week.   Discharge Exam: Vitals:   01/12/20 0338 01/12/20 0722  BP: 126/78 112/74  Pulse: 69 64  Resp:  19  Temp: 98.5 F (36.9 C) 98.3 F (36.8 C)  SpO2: 100% 99%   Vitals:   01/11/20 1704 01/11/20 1912 01/12/20 0338 01/12/20 0722  BP: 113/88 (!) 124/92 126/78 112/74  Pulse: 66 76 69 64  Resp: 18   19  Temp: 98.2 F (36.8 C) 98.9 F (37.2 C) 98.5 F (36.9 C) 98.3 F (36.8 C)  TempSrc: Oral Oral Oral Oral  SpO2: 99% 100% 100% 99%  Weight:      Height:        General: Pt is alert, awake, not in acute distress Cardiovascular: RRR, S1/S2 +, no rubs, no gallops Respiratory: CTA bilaterally, no wheezing, no rhonchi Abdominal: Soft, NT, ND, bowel sounds + Extremities: no edema, no cyanosis    The results of significant diagnostics from this hospitalization (including imaging, microbiology, ancillary and laboratory) are listed below for reference.     Microbiology: Recent Results (from the past 240 hour(s))  SARS Coronavirus 2 by RT PCR (hospital order, performed in [facility] hospital lab) Nasopharyngeal Nasopharyngeal Swab     Status: None   Collection Time: 01/09/20  5:16 PM   Specimen: Nasopharyngeal Swab  Result Value Ref Range Status   SARS Coronavirus 2 NEGATIVE NEGATIVE Final    Comment: (NOTE) SARS-CoV-2 target nucleic acids are NOT DETECTED.  The SARS-CoV-2 RNA is generally detectable in upper and lower respiratory specimens during the acute phase of infection. The lowest concentration of SARS-CoV-2 viral copies this assay can detect is 250 copies / mL. A negative  result does not preclude SARS-CoV-2 infection and should not be used as the sole basis for treatment or other patient management decisions.  A negative result may occur with improper specimen collection / handling, submission of specimen other than nasopharyngeal swab, presence of viral mutation(s) within the areas targeted by this assay, and inadequate number of viral copies (<250 copies / mL). A negative result must be combined with clinical observations, patient history, and epidemiological information.  Fact Sheet for Patients:   StrictlyIdeas.no  Fact Sheet for Healthcare Providers: BankingDealers.co.za  This test is not yet approved or  cleared by  the [employer] and has been authorized for detection and/or diagnosis of SARS-CoV-2 by FDA under an Emergency Use Authorization (EUA).  This EUA will remain in effect (meaning this test can be used) for the duration of the COVID-19 declaration under Section 564(b)(1) of the Act, 21 U.S.C. section 360bbb-3(b)(1), unless the authorization is terminated or revoked sooner.  Performed at [facility], [address] [place], [place] [address]      Labs: BNP (last 3 results) No results for input(s): BNP in the last 8760 hours. Basic Metabolic Panel: Recent Labs  Lab 01/09/20 1550 01/09/20 2226 01/10/20 0359 01/10/20 1209 01/11/20 0601 01/12/20 0427  NA 144  --  141 140 137 139  K 3.2*  --  3.4* 4.0 3.7 3.6  CL 106  --  107 104 104 106  CO2 27  --  23 26 23 26   GLUCOSE 141*  --  110* 96 102* 115*  BUN 9  --  9 8 10 13   CREATININE 0.55  --  0.74 0.64 0.57 0.68  CALCIUM 9.4  --  9.0 9.3 9.6 9.1  MG  --  2.2  --   --  2.1 2.1   Liver Function Tests: No results for input(s): AST, ALT, ALKPHOS, BILITOT, PROT, ALBUMIN in the last 168 hours. No results for input(s): LIPASE, AMYLASE in the last 168 hours. No results for input(s): AMMONIA in the last 168  hours. CBC: Recent Labs  Lab 01/09/20 1550 01/10/20 0359 01/10/20 1209 01/11/20 0601 01/12/20 0427  WBC 15.0* 9.6 9.6 10.2 10.4  HGB 15.6* 14.4 14.7 15.2* 14.7  HCT 44.9 41.4 43.6 42.8 41.7  MCV 91.1 92.4 93.8 91.3 90.5  PLT 312 271 273 278 280   Cardiac Enzymes: No results for input(s): CKTOTAL, CKMB, CKMBINDEX, TROPONINI in the last 168 hours. BNP: Invalid input(s): POCBNP CBG: No results for input(s): GLUCAP in the last 168 hours. D-Dimer No results for input(s): DDIMER in the last 72 hours. Hgb A1c Recent Labs    01/10/20 0359  HGBA1C 5.5   Lipid Profile Recent Labs    01/10/20 0359  CHOL 183  HDL 56  LDLCALC 111*  TRIG 79  CHOLHDL 3.3   Thyroid function studies No results for input(s): TSH, T4TOTAL, T3FREE, THYROIDAB in the last 72 hours.  Invalid input(s): FREET3 Anemia work up No results for input(s): VITAMINB12, FOLATE, FERRITIN, TIBC, IRON, RETICCTPCT in the last 72 hours. Urinalysis    Component Value Date/Time   APPEARANCEUR Clear 02/06/2015 1501   GLUCOSEU Negative 02/06/2015 1501   BILIRUBINUR Negative 02/06/2015 1501   PROTEINUR Negative 02/06/2015 1501   NITRITE Negative 02/06/2015 1501   LEUKOCYTESUR 3+ (A) 02/06/2015 1501   Sepsis Labs Invalid input(s): PROCALCITONIN,  WBC,  LACTICIDVEN Microbiology Recent Results (from the past 240 hour(s))  SARS Coronavirus 2 by RT PCR (hospital order, performed in [facility] hospital lab) Nasopharyngeal Nasopharyngeal Swab     Status: None   Collection Time: 01/09/20  5:16 PM   Specimen: Nasopharyngeal Swab  Result Value Ref Range Status   SARS Coronavirus 2 NEGATIVE NEGATIVE Final    Comment: (NOTE) SARS-CoV-2 target nucleic acids are NOT DETECTED.  The SARS-CoV-2 RNA is generally detectable in upper and lower respiratory specimens during the acute phase of infection. The lowest concentration of SARS-CoV-2 viral copies this assay can detect is 250 copies / mL. A negative result does not  preclude SARS-CoV-2 infection and should not be used as the sole basis for treatment  or other patient management decisions.  A negative result may occur with improper specimen collection / handling, submission of specimen other than nasopharyngeal swab, presence of viral mutation(s) within the areas targeted by this assay, and inadequate number of viral copies (<250 copies / mL). A negative result must be combined with clinical observations, patient history, and epidemiological information.  Fact Sheet for Patients:   StrictlyIdeas.no  Fact Sheet for Healthcare Providers: BankingDealers.co.za  This test is not yet approved or  cleared by the [place] FDA and has been authorized for detection and/or diagnosis of SARS-CoV-2 by FDA under an Emergency Use Authorization (EUA).  This EUA will remain in effect (meaning this test can be used) for the duration of the COVID-19 declaration under Section 564(b)(1) of the Act, 21 U.S.C. section 360bbb-3(b)(1), unless the authorization is terminated or revoked sooner.  Performed at [facility], [address] [place], [place] [address]      Time coordinating discharge: Over 30 minutes  SIGNED:   Ezekiel Slocumb, DO [facility] 01/12/2020, 9:15 AM   If 7PM-7AM, please contact night-coverage [email or web address]

## 2020-01-12 NOTE — Progress Notes (Signed)
SUBJECTIVE: No [place] chest pain   Vitals:   01/11/20 1704 01/11/20 1912 01/12/20 0338 01/12/20 0722  BP: 113/88 (!) 124/92 126/78 112/74  Pulse: 66 76 69 64  Resp: 18   19  Temp: 98.2 F (36.8 C) 98.9 F (37.2 C) 98.5 F (36.9 C) 98.3 F (36.8 C)  TempSrc: Oral Oral Oral Oral  SpO2: 99% 100% 100% 99%  Weight:      Height:        Intake/Output Summary (Last 24 hours) at 01/12/2020 0901 Last data filed at 01/12/2020 0448 Gross per 24 hour  Intake --  Output 1400 ml  Net -1400 ml    LABS: Basic Metabolic Panel: Recent Labs    01/11/20 0601 01/12/20 0427  NA 137 139  K 3.7 3.6  CL 104 106  CO2 23 26  GLUCOSE 102* 115*  BUN 10 13  CREATININE 0.57 0.68  CALCIUM 9.6 9.1  MG 2.1 2.1   Liver Function Tests: No results for input(s): AST, ALT, ALKPHOS, BILITOT, PROT, ALBUMIN in the last 72 hours. No results for input(s): LIPASE, AMYLASE in the last 72 hours. CBC: Recent Labs    01/11/20 0601 01/12/20 0427  WBC 10.2 10.4  HGB 15.2* 14.7  HCT 42.8 41.7  MCV 91.3 90.5  PLT 278 280   Cardiac Enzymes: No results for input(s): CKTOTAL, CKMB, CKMBINDEX, TROPONINI in the last 72 hours. BNP: Invalid input(s): POCBNP D-Dimer: No results for input(s): DDIMER in the last 72 hours. Hemoglobin A1C: Recent Labs    01/10/20 0359  HGBA1C 5.5   Fasting Lipid Panel: Recent Labs    01/10/20 0359  CHOL 183  HDL 56  LDLCALC 111*  TRIG 79  CHOLHDL 3.3   Thyroid Function Tests: No results for input(s): TSH, T4TOTAL, T3FREE, THYROIDAB in the last 72 hours.  Invalid input(s): FREET3 Anemia Panel: No results for input(s): VITAMINB12, FOLATE, FERRITIN, TIBC, IRON, RETICCTPCT in the last 72 hours.   PHYSICAL EXAM General: Well developed, well nourished, in no acute distress HEENT:  Normocephalic and atramatic Neck:  No JVD.  Lungs: Clear bilaterally to auscultation and percussion. Heart: HRRR . Normal S1 and S2 without gallops or murmurs.  Abdomen: Bowel sounds  are positive, abdomen soft and non-tender  Msk:  Back normal, normal gait. Normal strength and tone for age. Extremities: No clubbing, cyanosis or edema.   Neuro: Alert and oriented X 3. Psych:  Good affect, responds appropriately  TELEMETRY: Sinus rhythm  ASSESSMENT AND PLAN: Non-STEMI with occluded diagonal and LAD distally which is probably CTO and advise medical therapy.  Can be discharged with follow-up in my office on Tuesday at 11:00.  Principal Problem:   NSTEMI (non-ST elevation myocardial infarction) [facility]) Active Problems:   Hypokalemia    Camari Quintanilla A, MD, [facility] 01/12/2020 9:01 AM

## 2020-01-24 ENCOUNTER — Encounter: Payer: Federal, State, Local not specified - PPO | Attending: Internal Medicine | Admitting: *Deleted

## 2020-01-24 ENCOUNTER — Other Ambulatory Visit: Payer: Self-pay

## 2020-01-24 DIAGNOSIS — I214 Non-ST elevation (NSTEMI) myocardial infarction: Secondary | ICD-10-CM | POA: Insufficient documentation

## 2020-01-24 NOTE — Progress Notes (Signed)
Completed virtual orientation today.  EP evaluation is scheduled for Thursday 01/26/20 at 1430.  Documentation for diagnosis can be found in [facility] encounter 01/09/20.

## 2020-01-26 ENCOUNTER — Other Ambulatory Visit: Payer: Self-pay

## 2020-01-26 DIAGNOSIS — I214 Non-ST elevation (NSTEMI) myocardial infarction: Secondary | ICD-10-CM

## 2020-01-26 NOTE — Patient Instructions (Signed)
Patient Instructions  Patient Details  Name: Valerie Braun MRN: [id number] Date of Birth: 1965-11-20 Referring Provider:  Yolonda Kida, MD  Below are your personal goals for exercise, nutrition, and risk factors. Our goal is to help you stay on track towards obtaining and maintaining these goals. We will be discussing your progress on these goals with you throughout the program.  Initial Exercise Prescription:  Initial Exercise Prescription - 01/26/20 1500      Date of Initial Exercise RX and Referring Provider   Date 01/26/20    Referring Provider [facility]      Treadmill   MPH 2.6    Grade 2    Minutes 15    METs 3.75      Elliptical   Level 1    Speed 3    Minutes 15    METs 3.75      REL-XR   Level 3    Speed 50    Minutes 15    METs 3.75      Prescription Details   Frequency (times per week) 3    Duration Progress to 30 minutes of continuous aerobic without signs/symptoms of physical distress      Intensity   THRR 40-80% of Max Heartrate 104-144    Ratings of Perceived Exertion 11-15    Perceived Dyspnea 0-4      Resistance Training   Training Prescription Yes    Weight 4 lb    Reps 10-15           Exercise Goals: Frequency: Be able to perform aerobic exercise two to three times per week in program working toward 2-5 days per week of home exercise.  Intensity: Work with a perceived exertion of 11 (fairly light) - 15 (hard) while following your exercise prescription.  We will make changes to your prescription with you as you progress through the program.   Duration: Be able to do 30 to 45 minutes of continuous aerobic exercise in addition to a 5 minute warm-up and a 5 minute cool-down routine.   Nutrition Goals: Your personal nutrition goals will be established when you do your nutrition analysis with the dietician.  The following are general nutrition guidelines to follow: Cholesterol < 200mg /day Sodium < 1500mg /day Fiber: Women over 50 yrs  - 21 grams per day  Personal Goals:  Personal Goals and Risk Factors at Admission - 01/26/20 1537      Core Components/Risk Factors/Patient Goals on Admission    Weight Management Yes    Intervention Weight Management: Develop a combined nutrition and exercise program designed to reach desired caloric intake, while maintaining appropriate intake of nutrient and fiber, sodium and fats, and appropriate energy expenditure required for the weight goal.;Weight Management: Provide education and appropriate resources to help participant work on and attain dietary goals.    Admit Weight 173 lb (78.5 kg)    Goal Weight: Short Term 173 lb (78.5 kg)    Goal Weight: Long Term 173 lb (78.5 kg)    Expected Outcomes Short Term: Continue to assess and modify interventions until short term weight is achieved;Long Term: Adherence to nutrition and physical activity/exercise program aimed toward attainment of established weight goal;Weight Maintenance: Understanding of the daily nutrition guidelines, which includes 25-35% calories from fat, 7% or less cal from saturated fats, less than 200mg  cholesterol, less than 1.5gm of sodium, & 5 or more servings of fruits and vegetables daily;Understanding recommendations for meals to include 15-35% energy as protein, 25-35% energy from  fat, 35-60% energy from carbohydrates, less than 200mg  of dietary cholesterol, 20-35 gm of total fiber daily;Understanding of distribution of calorie intake throughout the day with the consumption of 4-5 meals/snacks    Hypertension Yes    Intervention Provide education on lifestyle modifcations including regular physical activity/exercise, weight management, moderate sodium restriction and increased consumption of fresh fruit, vegetables, and low fat dairy, alcohol moderation, and smoking cessation.;Monitor prescription use compliance.    Expected Outcomes Short Term: Continued assessment and intervention until BP is < 140/43mm HG in hypertensive  participants. < 130/30mm HG in hypertensive participants with diabetes, heart failure or chronic kidney disease.;Long Term: Maintenance of blood pressure at goal levels.    Lipids Yes    Intervention Provide education and support for participant on nutrition & aerobic/resistive exercise along with prescribed medications to achieve LDL 70mg , HDL >40mg .    Expected Outcomes Short Term: Participant states understanding of desired cholesterol values and is compliant with medications prescribed. Participant is following exercise prescription and nutrition guidelines.;Long Term: Cholesterol controlled with medications as prescribed, with individualized exercise RX and with personalized nutrition plan. Value goals: LDL < 70mg , HDL > 40 mg.           Tobacco Use Initial Evaluation: Social History   Tobacco Use  Smoking Status Never Smoker  Smokeless Tobacco Never Used    Exercise Goals and Review:  Exercise Goals    Row Name 01/26/20 1533             Exercise Goals   Increase Physical Activity Yes       Intervention Provide advice, education, support and counseling about physical activity/exercise needs.;Develop an individualized exercise prescription for aerobic and resistive training based on initial evaluation findings, risk stratification, comorbidities and participant's personal goals.       Expected Outcomes Short Term: Attend rehab on a regular basis to increase amount of physical activity.;Long Term: Add in home exercise to make exercise part of routine and to increase amount of physical activity.;Long Term: Exercising regularly at least 3-5 days a week.       Increase Strength and Stamina Yes       Intervention Provide advice, education, support and counseling about physical activity/exercise needs.;Develop an individualized exercise prescription for aerobic and resistive training based on initial evaluation findings, risk stratification, comorbidities and participant's personal  goals.       Expected Outcomes Short Term: Increase workloads from initial exercise prescription for resistance, speed, and METs.;Long Term: Improve cardiorespiratory fitness, muscular endurance and strength as measured by increased METs and functional capacity (6MWT);Short Term: Perform resistance training exercises routinely during rehab and add in resistance training at home       Able to understand and use rate of perceived exertion (RPE) scale Yes       Intervention Provide education and explanation on how to use RPE scale       Expected Outcomes Short Term: Able to use RPE daily in rehab to express subjective intensity level;Long Term:  Able to use RPE to guide intensity level when exercising independently       Able to understand and use Dyspnea scale Yes       Intervention Provide education and explanation on how to use Dyspnea scale       Expected Outcomes Short Term: Able to use Dyspnea scale daily in rehab to express subjective sense of shortness of breath during exertion;Long Term: Able to use Dyspnea scale to guide intensity level when exercising independently  Knowledge and understanding of Target Heart Rate Range (THRR) Yes       Intervention Provide education and explanation of THRR including how the numbers were predicted and where they are located for reference       Expected Outcomes Short Term: Able to state/look up THRR;Short Term: Able to use daily as guideline for intensity in rehab;Long Term: Able to use THRR to govern intensity when exercising independently       Able to check pulse independently Yes       Intervention Provide education and demonstration on how to check pulse in carotid and radial arteries.;Review the importance of being able to check your own pulse for safety during independent exercise       Expected Outcomes Short Term: Able to explain why pulse checking is important during independent exercise;Long Term: Able to check pulse independently and accurately        Understanding of Exercise Prescription Yes       Intervention Provide education, explanation, and written materials on patient's individual exercise prescription       Expected Outcomes Short Term: Able to explain program exercise prescription;Long Term: Able to explain home exercise prescription to exercise independently              Copy of goals given to participant.

## 2020-01-26 NOTE — Progress Notes (Signed)
Cardiac Individual Treatment Plan  Patient Details  Name: Valerie Braun MRN: [id number] Date of Birth: 26-Jul-1965 Referring Provider:     Cardiac Rehab from 01/26/2020 in [facility] Cardiac and Pulmonary Rehab  Referring Provider [facility]      Initial Encounter Date:    Cardiac Rehab from 01/26/2020 in [facility] Cardiac and Pulmonary Rehab  Date 01/26/20      Visit Diagnosis: NSTEMI (non-ST elevated myocardial infarction) [facility])  Patient's Home Medications on Admission:  Current Outpatient Medications:  .  aspirin EC 81 MG EC tablet, Take 1 tablet (81 mg total) by mouth daily. Swallow whole., Disp: 30 tablet, Rfl: 11 .  atorvastatin (LIPITOR) 80 MG tablet, Take 1 tablet (80 mg total) by mouth at bedtime., Disp: 30 tablet, Rfl: 2 .  Calcium Carb-Cholecalciferol (CALCIUM-VITAMIN D) 500-200 MG-UNIT tablet, Take 1 tablet by mouth daily. , Disp: , Rfl:  .  esomeprazole (NEXIUM) 40 MG capsule, 1 capsule once daily., Disp: , Rfl:  .  ibuprofen (ADVIL,MOTRIN) 200 MG tablet, Take 200 mg by mouth every 6 (six) hours as needed. , Disp: , Rfl:  .  Linaclotide (LINZESS) 145 MCG CAPS capsule, Take by mouth., Disp: , Rfl:  .  metoprolol succinate (TOPROL-XL) 100 MG 24 hr tablet, Take 1 tablet (100 mg total) by mouth daily. Take with or immediately following a meal., Disp: 30 tablet, Rfl: 2 .  metoprolol succinate (TOPROL-XL) 50 MG 24 hr tablet, Take by mouth., Disp: , Rfl:  .  Multiple Vitamin (MULTI-VITAMINS) TABS, Take 1 tablet by mouth daily. , Disp: , Rfl:  .  nitroGLYCERIN (NITROSTAT) 0.4 MG SL tablet, Place 1 tablet (0.4 mg total) under the tongue every 5 (five) minutes as needed for chest pain., Disp: 30 tablet, Rfl: 1 .  Omega-3 Fatty Acids (FISH OIL) 1000 MG CAPS, Take by mouth., Disp: , Rfl:  .  sacubitril-valsartan (ENTRESTO) 24-26 MG, Take 1 tablet by mouth 2 (two) times daily., Disp: 60 tablet, Rfl: 2 .  spironolactone (ALDACTONE) 25 MG tablet, Take 1 tablet (25 mg total) by mouth daily., Disp: 30  tablet, Rfl: 2 .  ticagrelor (BRILINTA) 90 MG TABS tablet, Take 1 tablet (90 mg total) by mouth 2 (two) times daily., Disp: 60 tablet, Rfl: 2  Past Medical History: Past Medical History:  Diagnosis Date  . Acute cystitis 12/27/2014  . 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 Avitaminosis D 08/25/2013  . Fatigue 08/25/2013  . Leg pain 08/25/2013    Tobacco Use: Social History   Tobacco Use  Smoking Status Never Smoker  Smokeless Tobacco Never Used    Labs: Recent Review Scientist, physiological    Labs for ITP Cardiac and Pulmonary Rehab Latest Ref Rng & Units 01/10/2020   Cholestrol 0 - 200 mg/dL 183   LDLCALC 0 - 99 mg/dL 111(H)   HDL >40 mg/dL 56   Trlycerides <150 mg/dL 79   Hemoglobin A1c 4.8 - 5.6 % 5.5       Exercise Target Goals: Exercise Program Goal: Individual exercise prescription set using results from initial 6 min walk test and THRR while considering  patient's activity barriers and safety.   Exercise Prescription Goal: Initial exercise prescription builds to 30-45 minutes a day of aerobic activity, 2-3 days per week.  Home exercise guidelines will be given to patient during program as part of exercise prescription that the participant will acknowledge.   Education: Aerobic Exercise & Resistance Training: - Gives group verbal and written instruction on the various components of exercise. Focuses on  aerobic and resistive training programs and the benefits of this training and how to safely progress through these programs..   Education: Exercise & Equipment Safety: - Individual verbal instruction and demonstration of equipment use and safety with use of the equipment.   Cardiac Rehab from 01/26/2020 in [facility] Cardiac and Pulmonary Rehab  Date 01/26/20  Educator AS  Instruction Review Code 1- Verbalizes Understanding      Education: Exercise Physiology & General Exercise Guidelines: - Group verbal and written instruction with models to review the exercise physiology of the  cardiovascular system and associated critical values. Provides general exercise guidelines with specific guidelines to those with heart or lung disease.    Education: Flexibility, Balance, Mind/Body Relaxation: Provides group verbal/written instruction on the benefits of flexibility and balance training, including mind/body exercise modes such as yoga, pilates and tai chi.  Demonstration and skill practice provided.   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 HR 97 bpm     Max Ex. BP 134/74     2 Minute Post BP 122/76            Oxygen Initial Assessment:   Oxygen Re-Evaluation:   Oxygen Discharge (Final Oxygen Re-Evaluation):   Initial Exercise Prescription:  Initial Exercise Prescription - 01/26/20 1500      Date of Initial Exercise RX and Referring Provider   Date 01/26/20    Referring Provider [place]      Treadmill   MPH 2.6    Grade 2    Minutes 15    METs 3.75      Elliptical   Level 1    Speed 3    Minutes 15    METs 3.75      REL-XR   Level 3    Speed 50    Minutes 15    METs 3.75       Prescription Details   Frequency (times per week) 3    Duration Progress to 30 minutes of continuous aerobic without signs/symptoms of physical distress      Intensity   THRR 40-80% of Max Heartrate 104-144    Ratings of Perceived Exertion 11-15    Perceived Dyspnea 0-4      Resistance Training   Training   Prescription Yes    Weight 4 lb    Reps 10-15           Perform Capillary Blood Glucose checks as needed.  Exercise Prescription Changes:  Exercise Prescription Changes    Row Name 01/26/20 1500             Response to Exercise   Blood Pressure (Admit) 110/66       Blood Pressure (Exercise) 134/74       Blood Pressure (Exit) 122/76       Heart Rate (Admit) 63 bpm       Heart Rate (Exercise) 97 bpm       Heart Rate (Exit) 68 bpm       Oxygen Saturation (Admit) 97 %       Oxygen Saturation (Exercise) 98 %       Rating of Perceived Exertion (Exercise) 9       Perceived Dyspnea (Exercise) 1       Symptoms none              Exercise Comments:   Exercise Goals and Review:  Exercise Goals    Row Name 01/26/20 1533             Exercise Goals   Increase Physical Activity Yes       Intervention Provide advice, education, support and counseling about physical activity/exercise needs.;Develop an individualized exercise prescription for aerobic and resistive training based on initial evaluation findings, risk stratification, comorbidities and participant's personal goals.       Expected Outcomes Short Term: Attend rehab on a regular basis to increase amount of physical activity.;Long Term: Add in home exercise to make exercise part of routine and to increase amount of physical activity.;Long Term: Exercising regularly at least 3-5 days a week.       Increase Strength and Stamina Yes       Intervention Provide advice, education, support and counseling about physical activity/exercise needs.;Develop an individualized exercise prescription for aerobic and  resistive training based on initial evaluation findings, risk stratification, comorbidities and participant's personal goals.       Expected Outcomes Short Term: Increase workloads from initial exercise prescription for resistance, speed, and METs.;Long Term: Improve cardiorespiratory fitness, muscular endurance and strength as measured by increased METs and functional capacity (6MWT);Short Term: Perform resistance training exercises routinely during rehab and add in resistance training at home       Able to understand and use rate of perceived exertion (RPE) scale Yes       Intervention Provide education and explanation on how to use RPE scale       Expected Outcomes Short Term: Able to use RPE daily in rehab to express subjective intensity level;Long Term:  Able to use RPE to guide intensity level when exercising independently       Able to understand and use Dyspnea scale Yes       Intervention Provide education and explanation on how to use Dyspnea scale       Expected Outcomes Short Term: Able to use Dyspnea scale daily in rehab to express subjective sense of shortness of breath during exertion;Long Term: Able to use Dyspnea scale to guide intensity level when exercising independently       Knowledge and understanding of Target Heart Rate Range (THRR) Yes       Intervention Provide education and explanation of THRR including how the numbers were predicted and where they are located for reference         Expected Outcomes Short Term: Able to state/look up THRR;Short Term: Able to use daily as guideline for intensity in rehab;Long Term: Able to use THRR to govern intensity when exercising independently       Able to check pulse independently Yes       Intervention Provide education and demonstration on how to check pulse in carotid and radial arteries.;Review the importance of being able to check your own pulse for safety during independent exercise       Expected Outcomes Short Term: Able to explain  why pulse checking is important during independent exercise;Long Term: Able to check pulse independently and accurately       Understanding of Exercise Prescription Yes       Intervention Provide education, explanation, and written materials on patient's individual exercise prescription       Expected Outcomes Short Term: Able to explain program exercise prescription;Long Term: Able to explain home exercise prescription to exercise independently              Exercise Goals Re-Evaluation :   Discharge Exercise Prescription (Final Exercise Prescription Changes):  Exercise Prescription Changes - 01/26/20 1500      Response to Exercise   Blood Pressure (Admit) 110/66    Blood Pressure (Exercise) 134/74    Blood Pressure (Exit) 122/76    Heart Rate (Admit) 63 bpm    Heart Rate (Exercise) 97 bpm    Heart Rate (Exit) 68 bpm    Oxygen Saturation (Admit) 97 %    Oxygen Saturation (Exercise) 98 %    Rating of Perceived Exertion (Exercise) 9    Perceived Dyspnea (Exercise) 1    Symptoms none           Nutrition:  Target Goals: Understanding of nutrition guidelines, daily intake of sodium <1500mg, cholesterol <200mg, calories 30% from fat and 7% or less from saturated fats, daily to have 5 or more servings of fruits and vegetables.  Education: Controlling Sodium/Reading Food Labels -Group verbal and written material supporting the discussion of sodium use in heart healthy nutrition. Review and explanation with models, verbal and written materials for utilization of the food label.   Education: General Nutrition Guidelines/Fats and Fiber: -Group instruction provided by verbal, written material, models and posters to present the general guidelines for heart healthy nutrition. Gives an explanation and review of dietary fats and fiber.   Biometrics:    Nutrition Therapy Plan and Nutrition Goals:   Nutrition Assessments:   MEDIFICTS Score Key:          ?70 Need to make dietary  changes          40-70 Heart Healthy Diet         ? 40 Therapeutic Level Cholesterol Diet  Nutrition Goals Re-Evaluation:   Nutrition Goals Discharge (Final Nutrition Goals Re-Evaluation):   Psychosocial: Target Goals: Acknowledge presence or absence of significant depression and/or stress, maximize coping skills, provide positive support system. Participant is able to verbalize types and ability to use techniques and skills needed for reducing stress and depression.   Education: Depression - Provides group verbal and written instruction on the correlation between heart/lung disease and depressed mood, treatment options, and the stigmas associated with seeking treatment.   Education: Sleep Hygiene -Provides group verbal and written instruction about how sleep can affect your health.  Define sleep hygiene, discuss sleep cycles and impact of sleep habits. Review good sleep hygiene tips.     Education: Stress and Anxiety: - Provides group verbal and   written instruction about the health risks of elevated stress and causes of high stress.  Discuss the correlation between heart/lung disease and anxiety and treatment options. Review healthy ways to manage with stress and anxiety.    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 Yes   lives by self, 34 yo daughter lives within a couple miles     Barriers   Psychosocial barriers to participate in program Psychosocial barriers identified (see note);The patient should benefit from training in stress management  and relaxation.      Screening Interventions   Interventions Encouraged to exercise    Expected Outcomes Short Term goal: Utilizing psychosocial counselor, staff and physician to assist with identification of specific Stressors or current issues interfering with healing process. Setting desired goal for each stressor or current issue identified.;Long Term Goal: Stressors or current issues are controlled or eliminated.;Short Term goal: Identification and review with participant of any Quality of Life or Depression concerns found by scoring the questionnaire.;Long Term goal: The participant improves quality of Life and PHQ9 Scores as seen by post scores and/or verbalization of changes           Quality of Life Scores:   Quality of Life - 01/26/20 1549      Quality of Life   Select Quality of Life      Quality of Life Scores   Health/Function Pre 16 %    Socioeconomic Pre 22.31 %    Psych/Spiritual Pre 25.71 %    Family Pre 25.2 %    GLOBAL Pre 20.7 %          Scores of 19 and below usually indicate a poorer quality of life in these areas.  A difference of  2-3 points is a clinically meaningful difference.  A difference of 2-3 points in the total score of the Quality of Life Index has been associated with significant improvement in overall quality of life, self-image, physical symptoms, and general health in studies assessing change in quality of life.  PHQ-9: Recent Review Flowsheet Data    Depression screen PHQ 2/9 01/26/2020   Decreased Interest 0   Down, Depressed, Hopeless 0   PHQ - 2 Score 0   Altered sleeping 3    Tired, decreased energy 3   Change in appetite 0   Feeling bad or failure about yourself  0   Trouble concentrating 0   Moving slowly or fidgety/restless 0   Suicidal thoughts 0   PHQ-9 Score 6   Difficult doing work/chores Somewhat difficult     Interpretation of Total Score  Total Score Depression Severity:  1-4 = Minimal depression, 5-9 = Mild  depression, 10-14 = Moderate depression, 15-19 = Moderately severe depression, 20-27 = Severe depression   Psychosocial Evaluation and Intervention:  Psychosocial Evaluation - 01/24/20 1544      Psychosocial Evaluation & Interventions   Interventions Stress management education;Encouraged to exercise with the program and follow exercise prescription    Comments Jini is coming into rehab   after having an MI just before her birthday!  She lives alone, but has a daughter that lives just a couple of miles down the road.  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  She is currently scheduled to return on 10/28.  She is hoping to feel better by then.  She is looking forward to the program to help get her strength and steps back up to return to work.  She is also interested in talking with the dietitian about her eating.  She does not think that she is eating enough.  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  She does her best to keep moving each day.    Expected Outcomes Short: Attend rehab to rebuild strength for work  Long: Able to return to work without a problem.    Continue Psychosocial Services  Follow up required by staff           Psychosocial Re-Evaluation:   Psychosocial Discharge (Final Psychosocial Re-Evaluation):   Vocational Rehabilitation: Provide vocational rehab assistance to qualifying candidates.   Vocational Rehab Evaluation & Intervention:  Vocational Rehab - 01/24/20 1538      Initial Vocational Rehab Evaluation & Intervention   Assessment shows need for Vocational Rehabilitation No   plans to return to work, needs to lift 70 lbs          Education: Education Goals: Education classes will be provided on a variety of topics geared toward better understanding of heart health and risk factor modification.  Participant will state understanding/return demonstration of topics presented as noted by education test scores.  Learning Barriers/Preferences:  Learning Barriers/Preferences - 01/24/20 1537      Learning Barriers/Preferences   Learning Barriers Sight;Hearing   glasses, hearing aid in R ear   Learning Preferences None           General Cardiac Education Topics:  AED/CPR: - Group verbal and written instruction with the use of models to demonstrate the basic use of the AED with the basic ABC's of resuscitation.   Anatomy & Physiology of the Heart: - Group verbal and written instruction and models provide basic cardiac anatomy and physiology, with the coronary electrical and arterial systems. Review of Valvular disease and Heart Failure   Cardiac Procedures: - Group verbal and written instruction to review commonly prescribed medications for heart disease. Reviews the medication, class of the drug, and side effects. Includes the steps to properly store meds and maintain the prescription regimen. (beta blockers and nitrates)   Cardiac Medications I: - Group verbal and written instruction to review commonly prescribed medications for heart disease. Reviews the medication, class of the drug, and side effects. Includes the steps to properly store meds and maintain the prescription regimen.   Cardiac Medications II: -Group verbal and written instruction to review commonly prescribed medications for heart disease. Reviews the medication, class of the drug, and side effects. (all other drug classes)    Go Sex-Intimacy & Heart Disease, Get SMART - Goal Setting: - Group verbal and written instruction through game format to discuss heart disease and the return to sexual intimacy. Provides group verbal and written material to discuss and apply goal setting through the application of the S.M.A.R.T. Method.   Other Matters of the Heart: - Provides group verbal, written materials   and models  to describe Stable Angina and Peripheral Artery. Includes description of the disease process and treatment options available to the cardiac patient.   Infection Prevention: - Provides verbal and written material to individual with discussion of infection control including proper hand washing and proper equipment cleaning during exercise session.   Cardiac Rehab from 01/26/2020 in [facility] Cardiac and Pulmonary Rehab  Date 01/26/20  Educator AS  Instruction Review Code 1- Verbalizes Understanding      Falls Prevention: - Provides verbal and written material to individual with discussion of falls prevention and safety.   Cardiac Rehab from 01/26/2020 in [facility] Cardiac and Pulmonary Rehab  Date 01/26/20  Educator AS  Instruction Review Code 1- Verbalizes Understanding      Other: -Provides group and verbal instruction on various topics (see comments)   Knowledge Questionnaire Score:  Knowledge Questionnaire Score - 01/26/20 1550      Knowledge Questionnaire Score   Pre Score 23/26 exercise/depression           Core Components/Risk Factors/Patient Goals at Admission:  Personal Goals and Risk Factors at Admission - 01/26/20 1537      Core Components/Risk Factors/Patient Goals on Admission    Weight Management Yes    Intervention Weight Management: Develop a combined nutrition and exercise program designed to reach desired caloric intake, while maintaining appropriate intake of nutrient and fiber, sodium and fats, and appropriate energy expenditure required for the weight goal.;Weight Management: Provide education and appropriate resources to help participant work on and attain dietary goals.    Admit Weight 173 lb (78.5 kg)    Goal Weight: Short Term 173 lb (78.5 kg)    Goal Weight: Long Term 173 lb (78.5 kg)    Expected Outcomes Short Term: Continue to assess and modify interventions until short term weight is achieved;Long Term: Adherence to nutrition and physical activity/exercise  program aimed toward attainment of established weight goal;Weight Maintenance: Understanding of the daily nutrition guidelines, which includes 25-35% calories from fat, 7% or less cal from saturated fats, less than 238m cholesterol, less than 1.5gm of sodium, & 5 or more servings of fruits and vegetables daily;Understanding recommendations for meals to include 15-35% energy as protein, 25-35% energy from fat, 35-60% energy from carbohydrates, less than 2076mof dietary cholesterol, 20-35 gm of total fiber daily;Understanding of distribution of calorie intake throughout the day with the consumption of 4-5 meals/snacks    Hypertension Yes    Intervention Provide education on lifestyle modifcations including regular physical activity/exercise, weight management, moderate sodium restriction and increased consumption of fresh fruit, vegetables, and low fat dairy, alcohol moderation, and smoking cessation.;Monitor prescription use compliance.    Expected Outcomes Short Term: Continued assessment and intervention until BP is < 140/9047mG in hypertensive participants. < 130/62m27m in hypertensive participants with diabetes, heart failure or chronic kidney disease.;Long Term: Maintenance of blood pressure at goal levels.    Lipids Yes    Intervention Provide education and support for participant on nutrition & aerobic/resistive exercise along with prescribed medications to achieve LDL <70mg62mL >40mg.68mExpected Outcomes Short Term: Participant states understanding of desired cholesterol values and is compliant with medications prescribed. Participant is following exercise prescription and nutrition guidelines.;Long Term: Cholesterol controlled with medications as prescribed, with individualized exercise RX and with personalized nutrition plan. Value goals: LDL < 70mg, 20m> 40 mg.           Education:Diabetes - Individual verbal and written instruction to review signs/symptoms of  diabetes, desired ranges  of glucose level fasting, after meals and with exercise. Acknowledge that pre and post exercise glucose checks will be done for 3 sessions at entry of program.   Education: Know Your Numbers and Risk Factors: -Group verbal and written instruction about important numbers in your health.  Discussion of what are risk factors and how they play a role in the disease process.  Review of Cholesterol, Blood Pressure, Diabetes, and BMI and the role they play in your overall health.   Core Components/Risk Factors/Patient Goals Review:    Core Components/Risk Factors/Patient Goals at Discharge (Final Review):    ITP Comments:  ITP Comments    Row Name 01/24/20 1549           ITP Comments Completed virtual orientation today.  EP evaluation is scheduled for Thursday 01/26/20 at 1430.  Documentation for diagnosis can be found in [facility] encounter 01/09/20.              Comments: initial ITP 

## 2020-01-30 ENCOUNTER — Other Ambulatory Visit: Payer: Self-pay

## 2020-01-30 ENCOUNTER — Encounter: Payer: Federal, State, Local not specified - PPO | Admitting: *Deleted

## 2020-01-30 DIAGNOSIS — I214 Non-ST elevation (NSTEMI) myocardial infarction: Secondary | ICD-10-CM | POA: Diagnosis not present

## 2020-01-30 NOTE — Progress Notes (Addendum)
Daily Session Note  Patient Details  Name: Valerie Braun MRN: [id number] Date of Birth: 06-03-1965 Referring Provider:     Cardiac Rehab from 01/26/2020 in [facility] Cardiac and Pulmonary Rehab  Referring Provider [facility]      Encounter Date: 01/30/2020  Check In:  Session Check In - 01/30/20 0825      Check-In   Supervising physician immediately available to respond to emergencies See telemetry face sheet for immediately available ER MD    Location [facility]-Cardiac & Pulmonary Rehab    Staff Present Heath Lark, RN, BSN, CCRP;Joseph Hood RCP,RRT,BSRT;Jessica [place], [place], [place], [place], CCET    Virtual Visit No    Medication changes reported     No    Fall or balance concerns reported    No    Warm-up and Cool-down Performed on first and last piece of equipment    Resistance Training Performed Yes    VAD Patient? No    PAD/SET Patient? No      Pain Assessment   Currently in Pain? No/denies              Social History   Tobacco Use  Smoking Status Never Smoker  Smokeless Tobacco Never Used    Goals Met:  Independence with exercise equipment Exercise tolerated well No report of cardiac concerns or symptoms  Goals Unmet:  Not Applicable  Comments: First full day of exercise!  Patient was oriented to gym and equipment including functions, settings, policies, and procedures.  Patient's individual exercise prescription and treatment plan were reviewed.  All starting workloads were established based on the results of the 6 minute walk test done at initial orientation visit.  The plan for exercise progression was also introduced and progression will be customized based on patient's performance and goals.   Dr. Emily Filbert is Medical Director for [place] and [facility].

## 2020-02-01 ENCOUNTER — Other Ambulatory Visit: Payer: Self-pay

## 2020-02-01 ENCOUNTER — Encounter: Payer: Federal, State, Local not specified - PPO | Admitting: *Deleted

## 2020-02-01 DIAGNOSIS — I214 Non-ST elevation (NSTEMI) myocardial infarction: Secondary | ICD-10-CM

## 2020-02-01 NOTE — Progress Notes (Signed)
Daily Session Note  Patient Details  Name: Valerie Braun MRN: [id number] Date of Birth: 03/22/1966 Referring Provider:     Cardiac Rehab from 01/26/2020 in [facility] Cardiac and Pulmonary Rehab  Referring Provider [place]      Encounter Date: 02/01/2020  Check In:  Session Check In - 02/01/20 0807      Check-In   Supervising physician immediately available to respond to emergencies See telemetry face sheet for immediately available ER MD    Location [facility]-Cardiac & Pulmonary Rehab    Staff Present Susanne Bice, RN, BSN, CCRP;Joseph Hood RCP,RRT,BSRT;Amanda Sommer, BA, ACSM CEP, Exercise Physiologist;Jessica [place], MA, RCEP, CCRP, CCET    Virtual Visit No    Medication changes reported     No    Fall or balance concerns reported    No    Warm-up and Cool-down Performed on first and last piece of equipment    Resistance Training Performed Yes    VAD Patient? No    PAD/SET Patient? No      Pain Assessment   Currently in Pain? No/denies              Social History   Tobacco Use  Smoking Status Never Smoker  Smokeless Tobacco Never Used    Goals Met:  Independence with exercise equipment Exercise tolerated well No report of cardiac concerns or symptoms  Goals Unmet:  Not Applicable  Comments: Pt able to follow exercise prescription today without complaint.  Will continue to monitor for progression.    Dr. Mark Miller is Medical Director for [facility] and [facility]. 

## 2020-02-03 ENCOUNTER — Other Ambulatory Visit: Payer: Self-pay

## 2020-02-03 ENCOUNTER — Other Ambulatory Visit: Payer: Self-pay | Admitting: Internal Medicine

## 2020-02-03 ENCOUNTER — Encounter: Payer: Federal, State, Local not specified - PPO | Admitting: *Deleted

## 2020-02-03 DIAGNOSIS — I214 Non-ST elevation (NSTEMI) myocardial infarction: Secondary | ICD-10-CM | POA: Diagnosis not present

## 2020-02-03 DIAGNOSIS — I219 Acute myocardial infarction, unspecified: Secondary | ICD-10-CM

## 2020-02-03 NOTE — Progress Notes (Signed)
Daily Session Note  Patient Details  Name: Valerie Braun MRN: [id number] Date of Birth: 1966/02/26 Referring Provider:     Cardiac Rehab from 01/26/2020 in [facility] Cardiac [facility] Pulmonary Rehab  Referring Provider [facility]      Encounter Date: 02/03/2020  Check In:  Session Check In - 02/03/20 0755      Check-In   Supervising physician immediately available to respond to emergencies See telemetry face sheet for immediately available ER MD    Location [facility]-Cardiac & Pulmonary Rehab    Staff Present Heath Lark, RN, BSN, CCRP;Joseph Hood RCP,RRT,BSRT;Jessica [place], [place], [place], [place], CCET    Virtual Visit No    Medication changes reported     No    Fall or balance concerns reported    No    Warm-up and Cool-down Performed on first and last piece of equipment    Resistance Training Performed Yes    VAD Patient? No    PAD/SET Patient? No      Pain Assessment   Currently in Pain? No/denies              Social History   Tobacco Use  Smoking Status Never Smoker  Smokeless Tobacco Never Used    Goals Met:  Independence with exercise equipment Exercise tolerated well No report of cardiac concerns or symptoms  Goals Unmet:  Not Applicable  Comments: Pt able to follow exercise prescription today without complaint.  Will continue to monitor for progression.    Dr. Emily Filbert is Medical Director for [facility] and [facility].

## 2020-02-06 ENCOUNTER — Other Ambulatory Visit: Payer: Self-pay

## 2020-02-06 ENCOUNTER — Encounter: Payer: Federal, State, Local not specified - PPO | Admitting: *Deleted

## 2020-02-06 DIAGNOSIS — I214 Non-ST elevation (NSTEMI) myocardial infarction: Secondary | ICD-10-CM | POA: Diagnosis not present

## 2020-02-06 NOTE — Progress Notes (Signed)
Daily Session Note  Patient Details  Name: Valerie Braun MRN: [id number] Date of Birth: July 22, 1965 Referring Provider:     Cardiac Rehab from 01/26/2020 in [facility] Cardiac and Pulmonary Rehab  Referring Provider [facility]      Encounter Date: 02/06/2020  Check In:  Session Check In - 02/06/20 0916      Check-In   Supervising physician immediately available to respond to emergencies See telemetry face sheet for immediately available ER MD    Location [facility]-Cardiac & Pulmonary Rehab    Staff Present Heath Lark, RN, BSN, [place]aveda Norman, BS, ACSM CEP, Exercise Physiologist;Joseph Tessie Fass RCP,RRT,BSRT    Virtual Visit No    Medication changes reported     No    Fall or balance concerns reported    No    Warm-up and Cool-down Performed on first and last piece of equipment    Resistance Training Performed Yes    VAD Patient? No    PAD/SET Patient? No      Pain Assessment   Currently in Pain? No/denies              Social History   Tobacco Use  Smoking Status Never Smoker  Smokeless Tobacco Never Used    Goals Met:  Independence with exercise equipment Exercise tolerated well No report of cardiac concerns or symptoms  Goals Unmet:  Not Applicable  Comments: Pt able to follow exercise prescription today without complaint.  Will continue to monitor for progression.    Dr. Emily Filbert is Medical Director for [place] and [facility].

## 2020-02-08 ENCOUNTER — Encounter: Payer: Federal, State, Local not specified - PPO | Admitting: *Deleted

## 2020-02-08 ENCOUNTER — Other Ambulatory Visit: Payer: Self-pay

## 2020-02-08 DIAGNOSIS — I214 Non-ST elevation (NSTEMI) myocardial infarction: Secondary | ICD-10-CM | POA: Diagnosis not present

## 2020-02-08 NOTE — Progress Notes (Signed)
Daily Session Note  Patient Details  Name: Valerie Braun MRN: [id number] Date of Birth: 28-Apr-1965 Referring Provider:     Cardiac Rehab from 01/26/2020 in [facility] Cardiac and Pulmonary Rehab  Referring Provider [facility]      Encounter Date: 02/08/2020  Check In:  Session Check In - 02/08/20 0746      Check-In   Supervising physician immediately available to respond to emergencies See telemetry face sheet for immediately available ER MD    Location [facility]-Cardiac & Pulmonary Rehab    Staff Present Calton Dach, RN, BSN, CCRP;Jessica [place], MA, RCEP, CCRP, CCET;Joseph [employer], [place], ACSM CEP, Exercise Physiologist   [employer]ie [employer] RN   Virtual Visit No    Medication changes reported     No    Fall or balance concerns reported    No    Warm-up and Cool-down Performed on first and last piece of equipment    Resistance Training Performed Yes    VAD Patient? No    PAD/SET Patient? No      Pain Assessment   Currently in Pain? No/denies              Social History   Tobacco Use  Smoking Status Never Smoker  Smokeless Tobacco Never Used    Goals Met:  Independence with exercise equipment Exercise tolerated well No report of cardiac concerns or symptoms  Goals Unmet:  Not Applicable  Comments: Pt able to follow exercise prescription today without complaint.  Will continue to monitor for progression.    Dr. Emily Filbert is Medical Director for [facility] and [facility].

## 2020-02-10 ENCOUNTER — Encounter: Payer: Federal, State, Local not specified - PPO | Admitting: *Deleted

## 2020-02-10 ENCOUNTER — Other Ambulatory Visit: Payer: Self-pay

## 2020-02-10 DIAGNOSIS — I214 Non-ST elevation (NSTEMI) myocardial infarction: Secondary | ICD-10-CM

## 2020-02-10 NOTE — Progress Notes (Signed)
Daily Session Note  Patient Details  Name: Valerie Braun MRN: [id number] Date of Birth: 02/03/1966 Referring Provider:     Cardiac Rehab from 01/26/2020 in [facility] Cardiac and Pulmonary Rehab  Referring Provider [facility]      Encounter Date: 02/10/2020  Check In:  Session Check In - 02/10/20 0834      Check-In   Supervising physician immediately available to respond to emergencies See telemetry face sheet for immediately available ER MD    Location [facility]-Cardiac & Pulmonary Rehab    Staff Present Heath Lark, RN, BSN, CCRP;Joseph Hood RCP,RRT,BSRT;Jessica [place], [place], [place], [place], CCET    Virtual Visit No    Medication changes reported     No    Fall or balance concerns reported    No    Warm-up and Cool-down Performed on first and last piece of equipment    Resistance Training Performed Yes    VAD Patient? No    PAD/SET Patient? No      Pain Assessment   Currently in Pain? No/denies              Social History   Tobacco Use  Smoking Status Never Smoker  Smokeless Tobacco Never Used    Goals Met:  Independence with exercise equipment Exercise tolerated well No report of cardiac concerns or symptoms  Goals Unmet:  Not Applicable  Comments: Pt able to follow exercise prescription today without complaint.  Will continue to monitor for progression.    Dr. Emily Filbert is Medical Director for [place] and [facility].

## 2020-02-13 ENCOUNTER — Other Ambulatory Visit: Payer: Self-pay

## 2020-02-13 ENCOUNTER — Encounter: Payer: Federal, State, Local not specified - PPO | Admitting: *Deleted

## 2020-02-13 DIAGNOSIS — I214 Non-ST elevation (NSTEMI) myocardial infarction: Secondary | ICD-10-CM

## 2020-02-13 NOTE — Progress Notes (Signed)
Daily Session Note  Patient Details  Name: Valerie Braun MRN: [id number] Date of Birth: 28-May-1965 Referring Provider:     Cardiac Rehab from 01/26/2020 in [facility] Cardiac and Pulmonary Rehab  Referring Provider [facility]      Encounter Date: 02/13/2020  Check In:  Session Check In - 02/13/20 0911      Check-In   Supervising physician immediately available to respond to emergencies See telemetry face sheet for immediately available ER MD    Location [facility]-Cardiac & Pulmonary Rehab    Staff Present Heath Lark, RN, BSN, CCRP;Joseph Hood RCP,RRT,BSRT;Kelly [place], [place], ACSM CEP, Exercise Physiologist    Virtual Visit No    Medication changes reported     No    Fall or balance concerns reported    No    Warm-up and Cool-down Performed on first and last piece of equipment    Resistance Training Performed Yes    VAD Patient? No    PAD/SET Patient? No      Pain Assessment   Currently in Pain? No/denies              Social History   Tobacco Use  Smoking Status Never Smoker  Smokeless Tobacco Never Used    Goals Met:  Independence with exercise equipment Exercise tolerated well No report of cardiac concerns or symptoms  Goals Unmet:  Not Applicable  Comments: Pt able to follow exercise prescription today without complaint.  Will continue to monitor for progression.    Dr. Emily Filbert is Medical Director for [place] and [facility].

## 2020-02-15 ENCOUNTER — Other Ambulatory Visit: Payer: Self-pay

## 2020-02-15 ENCOUNTER — Encounter: Payer: Federal, State, Local not specified - PPO | Admitting: *Deleted

## 2020-02-15 VITALS — Ht 65.0 in | Wt 176.2 lb

## 2020-02-15 DIAGNOSIS — I214 Non-ST elevation (NSTEMI) myocardial infarction: Secondary | ICD-10-CM

## 2020-02-15 NOTE — Progress Notes (Signed)
Discharge Progress Report  Patient Details  Name: Valerie Braun MRN: [id number] Date of Birth: 1965/10/08 Referring Provider:     Cardiac Rehab from 01/26/2020 in [facility] Cardiac and Pulmonary Rehab  Referring Provider [facility]       Number of Visits: 12  Reason for Discharge:  Patient reached a stable level of exercise. Patient independent in their exercise. Patient has met program and personal goals. Early Exit:  Back to work  Smoking History:  Social History   Tobacco Use  Smoking Status Never Smoker  Smokeless Tobacco Never Used    Diagnosis:  NSTEMI (non-ST elevated myocardial infarction) ([place])  ADL [facility]:   Initial Exercise Prescription:  Initial Exercise Prescription - 01/26/20 1500      Date of Initial Exercise RX and Referring Provider   Date 01/26/20    Referring Provider [facility]      Treadmill   MPH 2.6    Grade 2    Minutes 15    METs 3.75      Elliptical   Level 1    Speed 3    Minutes 15    METs 3.75      REL-XR   Level 3    Speed 50    Minutes 15    METs 3.75      Prescription Details   Frequency (times per week) 3    Duration Progress to 30 minutes of continuous aerobic without signs/symptoms of physical distress      Intensity   THRR 40-80% of Max Heartrate 104-144    Ratings of Perceived Exertion 11-15    Perceived Dyspnea 0-4      Resistance Training   Training Prescription Yes    Weight 4 lb    Reps 10-15           Discharge Exercise Prescription (Final Exercise Prescription Changes):  Exercise Prescription Changes - 02/01/20 0900      Response to Exercise   Blood Pressure (Admit) 110/62    Blood Pressure (Exercise) 152/70    Blood Pressure (Exit) 104/64    Heart Rate (Admit) 66 bpm    Heart Rate (Exercise) 136 bpm    Heart Rate (Exit) 84 bpm    Rating of Perceived Exertion (Exercise) 15    Symptoms none    Comments second full day of exercise    Duration Continue with 30 min of aerobic exercise without  signs/symptoms of physical distress.    Intensity THRR unchanged      Progression   Progression Continue to progress workloads to maintain intensity without signs/symptoms of physical distress.    Average METs 3.1      Resistance Training   Training Prescription Yes    Weight 4 lb    Reps 10-15      Interval Training   Interval Training No      Treadmill   MPH 2.6    Grade 2    Minutes 15    METs 3.71      Elliptical   Level 1    Speed 3    Minutes 15    METs 2.5      REL-XR   Level 3    Minutes 15           Functional Capacity:  6 Minute Walk    Row Name 01/26/20 1523 02/15/20 0742       6 Minute Walk   Phase Initial Discharge    Distance 1385 feet 1700 feet  Distance % Change -- 22.7 %    Distance Feet Change -- 315 ft    Walk Time 6 minutes 6 minutes    # of Rest Breaks 0 0    MPH 2.62 3.22    METS 3.75 4.54    RPE 9 13    Perceived Dyspnea  1 --    VO2 Peak 13.12 15.9    Symptoms No No    Resting HR 63 bpm 95 bpm    Resting BP 110/66 110/62    Resting Oxygen Saturation  97 % --    Exercise Oxygen Saturation  during 6 min walk 98 % --    Max Ex. HR 97 bpm 121 bpm    Max Ex. BP 134/74 138/64    2 Minute Post BP 122/76 --           Psychological, QOL, Others - Outcomes: PHQ 2/9: Depression screen PHQ 2/9 01/26/2020  Decreased Interest 0  Down, Depressed, Hopeless 0  PHQ - 2 Score 0  Altered sleeping 3  Tired, decreased energy 3  Change in appetite 0  Feeling bad or failure about yourself  0  Trouble concentrating 0  Moving slowly or fidgety/restless 0  Suicidal thoughts 0  PHQ-9 Score 6  Difficult doing work/chores Somewhat difficult    Quality of Life:  Quality of Life - 01/26/20 1549      Quality of Life   Select Quality of Life      Quality of Life Scores   Health/Function Pre 16 %    Socioeconomic Pre 22.31 %    Psych/Spiritual Pre 25.71 %    Family Pre 25.2 %    GLOBAL Pre 20.7 %             Nutrition:   Nutrition Therapy & Goals - 02/13/20 0818      Nutrition Therapy   Diet heart healthy, low sodium    Drug/Food Interactions Statins/Certain Fruits    Protein (specify units) 60g    Fiber 25 grams    Whole Grain Foods 3 servings    Saturated Fats 12 max. grams    Fruits and Vegetables 5 servings/day   She does not like fruit or beans - ideally 8 fruits and vegetables   Sodium 1.5 grams      Personal Nutrition Goals   Nutrition Goal ST: Review paperwork LT: Re-incorporating grains (whole grains), getting a variety of vegetables (rainbow of plants), limiting red meat to 1-2x/week long term, adding in heart healthy snacks in between breakfast and lunch.    Comments She feels like she doesn't eat a lot - no fruit (including tomatoes), beans, or milk. Spinach, broccoli, collard greens, turnip greens, cabbage, onions, peppers. She reads her labels. Has IBS for which she has been taking medication for. Trying to limit bread and red meat. She has been eating chicken and is getting bored. B: 2 eggs - "I can't believe it's not butter" or olive oil with protein shake.  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 1/1:30 cooks dinner: thin ribeyes, chicken fajitas, chicken breast (uses olive oil and Ms. Dash and pepper) with vegetables on the side. S: no-sugar chocolate popsicles or rice cakes with peanut butter. She reports constipation with her medication - every two days (takes milk of magnesia). Drinks: water. She reports drinking Pedialyte which helped her go to the bathroom, discussed how that was likely the magnesium. Discussed how with IBS sugar free items sweetened with sugar  alcohols could exacerbate her IBS. Discussed heart healthy eating and MyPlate as well as recommendations. She has already made many changes. Discussed incorporating whole grains, variety of vegetables, limiting red meat to 1-2x/week long term, adding in heart healthy snacks.      Intervention Plan   Intervention  Prescribe, educate and counsel regarding individualized specific dietary modifications aiming towards targeted core components such as weight, hypertension, lipid management, diabetes, heart failure and other comorbidities.;Nutrition handout(s) given to patient.    Expected Outcomes Short Term Goal: Understand basic principles of dietary content, such as calories, fat, sodium, cholesterol and nutrients.;Short Term Goal: A plan has been developed with personal nutrition goals set during dietitian appointment.;Long Term Goal: Adherence to prescribed nutrition plan.

## 2020-02-15 NOTE — Progress Notes (Signed)
Cardiac Individual Treatment Plan  Patient Details  Name: Valerie Braun MRN: [id number] Date of Birth: 07/14/1965 Referring Provider:     Cardiac Rehab from 01/26/2020 in [facility] Cardiac and Pulmonary Rehab  Referring Provider [place]      Initial Encounter Date:    Cardiac Rehab from 01/26/2020 in [facility] Cardiac and Pulmonary Rehab  Date 01/26/20      Visit Diagnosis: NSTEMI (non-ST elevated myocardial infarction) (HCC)  Patient's Home Medications on Admission:  Current Outpatient Medications:  .  aspirin EC 81 MG EC tablet, Take 1 tablet (81 mg total) by mouth daily. Swallow whole., Disp: 30 tablet, Rfl: 11 .  atorvastatin (LIPITOR) 80 MG tablet, Take 1 tablet (80 mg total) by mouth at bedtime., Disp: 30 tablet, Rfl: 2 .  Calcium Carb-Cholecalciferol (CALCIUM-VITAMIN D) 500-200 MG-UNIT tablet, Take 1 tablet by mouth daily. , Disp: , Rfl:  .  esomeprazole (NEXIUM) 40 MG capsule, 1 capsule once daily., Disp: , Rfl:  .  ibuprofen (ADVIL,MOTRIN) 200 MG tablet, Take 200 mg by mouth every 6 (six) hours as needed. , Disp: , Rfl:  .  Linaclotide (LINZESS) 145 MCG CAPS capsule, Take by mouth., Disp: , Rfl:  .  metoprolol succinate (TOPROL-XL) 100 MG 24 hr tablet, Take 1 tablet (100 mg total) by mouth daily. Take with or immediately following a meal., Disp: 30 tablet, Rfl: 2 .  metoprolol succinate (TOPROL-XL) 50 MG 24 hr tablet, Take by mouth., Disp: , Rfl:  .  Multiple Vitamin (MULTI-VITAMINS) TABS, Take 1 tablet by mouth daily. , Disp: , Rfl:  .  nitroGLYCERIN (NITROSTAT) 0.4 MG SL tablet, Place 1 tablet (0.4 mg total) under the tongue every 5 (five) minutes as needed for chest pain., Disp: 30 tablet, Rfl: 1 .  Omega-3 Fatty Acids (FISH OIL) 1000 MG CAPS, Take by mouth., Disp: , Rfl:  .  sacubitril-valsartan (ENTRESTO) 24-26 MG, Take 1 tablet by mouth 2 (two) times daily., Disp: 60 tablet, Rfl: 2 .  spironolactone (ALDACTONE) 25 MG tablet, Take 1 tablet (25 mg total) by mouth daily., Disp: 30  tablet, Rfl: 2 .  ticagrelor (BRILINTA) 90 MG TABS tablet, Take 1 tablet (90 mg total) by mouth 2 (two) times daily., Disp: 60 tablet, Rfl: 2  Past Medical History: Past Medical History:  Diagnosis Date  . Acute cystitis 12/27/2014  . 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 Avitaminosis D 08/25/2013  . Fatigue 08/25/2013  . Leg pain 08/25/2013    Tobacco Use: Social History   Tobacco Use  Smoking Status Never Smoker  Smokeless Tobacco Never Used    Labs: Recent Review Flowsheet Data    Labs for ITP Cardiac and Pulmonary Rehab Latest Ref Rng & Units 01/10/2020   Cholestrol 0 - 200 mg/dL 183   LDLCALC 0 - 99 mg/dL 111(H)   HDL >40 mg/dL 56   Trlycerides <150 mg/dL 79   Hemoglobin A1c 4.8 - 5.6 % 5.5       Exercise Target Goals: Exercise Program Goal: Individual exercise prescription set using results from initial 6 min walk test and THRR while considering  patient's activity barriers and safety.   Exercise Prescription Goal: Initial exercise prescription builds to 30-45 minutes a day of aerobic activity, 2-3 days per week.  Home exercise guidelines will be given to patient during program as part of exercise prescription that the participant will acknowledge.   Education: Aerobic Exercise & Resistance Training: - Gives group verbal and written instruction on the various components of exercise. Focuses on   aerobic and resistive training programs and the benefits of this training and how to safely progress through these programs..   Cardiac Rehab from 02/08/2020 in [facility] Cardiac and Pulmonary Rehab  Date 02/08/20  Educator [facility]  Instruction Review Code 1- Verbalizes Understanding      Education: Exercise & Equipment Safety: - Individual verbal instruction and demonstration of equipment use and safety with use of the equipment.   Cardiac Rehab from 02/08/2020 in [facility] Cardiac and Pulmonary Rehab  Date 01/26/20  Educator [facility]  Instruction Review Code 1- Verbalizes Understanding       Education: Exercise Physiology & General Exercise Guidelines: - Group verbal and written instruction with models to review the exercise physiology of the cardiovascular system and associated critical values. Provides general exercise guidelines with specific guidelines to those with heart or lung disease.    Cardiac Rehab from 02/08/2020 in [facility] Cardiac and Pulmonary Rehab  Date 02/01/20  Educator [facility]  Instruction Review Code 1- Verbalizes Understanding      Education: Flexibility, Balance, Mind/Body Relaxation: Provides group verbal/written instruction on the benefits of flexibility and balance training, including mind/body exercise modes such as yoga, pilates and tai chi.  Demonstration and skill practice provided.   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 HR 97 bpm 121 bpm    Max Ex. BP 134/74 138/64    2 Minute Post BP 122/76 --           Oxygen Initial  Assessment:   Oxygen Re-Evaluation:   Oxygen Discharge (Final Oxygen Re-Evaluation):   Initial Exercise Prescription:  Initial Exercise Prescription - 01/26/20 1500      Date of Initial Exercise RX and Referring Provider   Date 01/26/20    Referring Provider [facility]      Treadmill   MPH 2.6    Grade 2    Minutes 15    METs 3.75      Elliptical   Level 1    Speed 3    Minutes 15    METs 3.75      REL-XR   Level 3    Speed 50  Minutes 15    METs 3.75      Prescription Details   Frequency (times per week) 3    Duration Progress to 30 minutes of continuous aerobic without signs/symptoms of physical distress      Intensity   THRR 40-80% of Max Heartrate 104-144    Ratings of Perceived Exertion 11-15    Perceived Dyspnea 0-4      Resistance Training   Training Prescription Yes    Weight 4 lb    Reps 10-15           Perform Capillary Blood Glucose checks as needed.  Exercise Prescription Changes:  Exercise Prescription Changes    Row Name 01/26/20 1500 02/01/20 0900           Response to Exercise   Blood Pressure (Admit) 110/66 110/62      Blood Pressure (Exercise) 134/74 152/70      Blood Pressure (Exit) 122/76 104/64      Heart Rate (Admit) 63 bpm 66 bpm      Heart Rate (Exercise) 97 bpm 136 bpm      Heart Rate (Exit) 68 bpm 84 bpm      Oxygen Saturation (Admit) 97 % --      Oxygen Saturation (Exercise) 98 % --      Rating of Perceived Exertion (Exercise) 9 15      Perceived Dyspnea (Exercise) 1 --      Symptoms none none      Comments -- second full day of exercise      Duration -- Continue with 30 min of aerobic exercise without signs/symptoms of physical distress.      Intensity -- THRR unchanged        Progression   Progression -- Continue to progress workloads to maintain intensity without signs/symptoms of physical distress.      Average METs -- 3.1        Resistance Training   Training Prescription -- Yes      Weight -- 4 lb       Reps -- 10-15        Interval Training   Interval Training -- No        Treadmill   MPH -- 2.6      Grade -- 2      Minutes -- 15      METs -- 3.71        Elliptical   Level -- 1      Speed -- 3      Minutes -- 15      METs -- 2.5        REL-XR   Level -- 3      Minutes -- 15             Exercise Comments:  Exercise Comments    Row Name 01/30/20 0831 02/15/20 0743         Exercise Comments First full day of exercise!  Patient was oriented to gym and equipment including functions, settings, policies, and procedures.  Patient's individual exercise prescription and treatment plan were reviewed.  All starting workloads were established based on the results of the 6 minute walk test done at initial orientation visit.  The plan for exercise progression was also introduced and progression will be customized based on patient's performance and goals. Rania graduated today from  rehab with 12 sessions completed.  Details of the patient's exercise prescription and what She needs to do in order to continue the  prescription and progress were discussed with patient.  Patient was given a copy of prescription and goals.  Patient verbalized understanding.  Naylah plans to continue to exercise by walk on her own.             Exercise Goals and Review:  Exercise Goals    Row Name 01/26/20 1533             Exercise Goals   Increase Physical Activity Yes       Intervention Provide advice, education, support and counseling about physical activity/exercise needs.;Develop an individualized exercise prescription for aerobic and resistive training based on initial evaluation findings, risk stratification, comorbidities and participant's personal goals.       Expected Outcomes Short Term: Attend rehab on a regular basis to increase amount of physical activity.;Long Term: Add in home exercise to make exercise part of routine and to increase amount of physical activity.;Long Term: Exercising  regularly at least 3-5 days a week.       Increase Strength and Stamina Yes       Intervention Provide advice, education, support and counseling about physical activity/exercise needs.;Develop an individualized exercise prescription for aerobic and resistive training based on initial evaluation findings, risk stratification, comorbidities and participant's personal goals.       Expected Outcomes Short Term: Increase workloads from initial exercise prescription for resistance, speed, and METs.;Long Term: Improve cardiorespiratory fitness, muscular endurance and strength as measured by increased METs and functional capacity (6MWT);Short Term: Perform resistance training exercises routinely during rehab and add in resistance training at home       Able to understand and use rate of perceived exertion (RPE) scale Yes       Intervention Provide education and explanation on how to use RPE scale       Expected Outcomes Short Term: Able to use RPE daily in rehab to express subjective intensity level;Long Term:  Able to use RPE to guide intensity level when exercising independently       Able to understand and use Dyspnea scale Yes       Intervention Provide education and explanation on how to use Dyspnea scale       Expected Outcomes Short Term: Able to use Dyspnea scale daily in rehab to express subjective sense of shortness of breath during exertion;Long Term: Able to use Dyspnea scale to guide intensity level when exercising independently       Knowledge and understanding of Target Heart Rate Range (THRR) Yes       Intervention Provide education and explanation of THRR including how the numbers were predicted and where they are located for reference       Expected Outcomes Short Term: Able to state/look up THRR;Short Term: Able to use daily as guideline for intensity in rehab;Long Term: Able to use THRR to govern intensity when exercising independently       Able to check pulse independently Yes        Intervention Provide education and demonstration on how to check pulse in carotid and radial arteries.;Review the importance of being able to check your own pulse for safety during independent exercise       Expected Outcomes Short Term: Able to explain why pulse checking is important during independent exercise;Long Term: Able to check pulse independently and accurately       Understanding of Exercise Prescription Yes       Intervention Provide education, explanation, and written materials on patient's individual exercise prescription  Expected Outcomes Short Term: Able to explain program exercise prescription;Long Term: Able to explain home exercise prescription to exercise independently              Exercise Goals Re-Evaluation :  Exercise Goals Re-Evaluation    Row Name 01/30/20 0832 02/01/20 0950           Exercise Goal Re-Evaluation   Exercise Goals Review Able to understand and use rate of perceived exertion (RPE) scale;Knowledge and understanding of Target Heart Rate Range (THRR);Understanding of Exercise Prescription Increase Physical Activity;Increase Strength and Stamina;Understanding of Exercise Prescription      Comments Reviewed RPE and dyspnea scales, THR and program prescription with pt today.  Pt voiced understanding and was given a copy of goals to take home. Natausha has completed her first two full days of exercise.  She is doing well so far.  We will continue to monitor her progress.      Expected Outcomes Short: Use RPE daily to regulate intensity. Long: Follow program prescription in THR. Short: Continue to attend rehab regularly Long: Continue to follow program prescription             Discharge Exercise Prescription (Final Exercise Prescription Changes):  Exercise Prescription Changes - 02/01/20 0900      Response to Exercise   Blood Pressure (Admit) 110/62    Blood Pressure (Exercise) 152/70    Blood Pressure (Exit) 104/64    Heart Rate (Admit) 66 bpm     Heart Rate (Exercise) 136 bpm    Heart Rate (Exit) 84 bpm    Rating of Perceived Exertion (Exercise) 15    Symptoms none    Comments second full day of exercise    Duration Continue with 30 min of aerobic exercise without signs/symptoms of physical distress.    Intensity THRR unchanged      Progression   Progression Continue to progress workloads to maintain intensity without signs/symptoms of physical distress.    Average METs 3.1      Resistance Training   Training Prescription Yes    Weight 4 lb    Reps 10-15      Interval Training   Interval Training No      Treadmill   MPH 2.6    Grade 2    Minutes 15    METs 3.71      Elliptical   Level 1    Speed 3    Minutes 15    METs 2.5      REL-XR   Level 3    Minutes 15           Nutrition:  Target Goals: Understanding of nutrition guidelines, daily intake of sodium '1500mg'$ , cholesterol '200mg'$ , calories 30% from fat and 7% or less from saturated fats, daily to have 5 or more servings of fruits and vegetables.  Education: Controlling Sodium/Reading Food Labels -Group verbal and written material supporting the discussion of sodium use in heart healthy nutrition. Review and explanation with models, verbal and written materials for utilization of the food label.   Education: General Nutrition Guidelines/Fats and Fiber: -Group instruction provided by verbal, written material, models and posters to present the general guidelines for heart healthy nutrition. Gives an explanation and review of dietary fats and fiber.   BiometricsBarbie Haggis - 02/15/20 0743       Post  Biometrics   Height $Remov'5\' 5"'InBKMp$  (1.651 m)    Weight 176 lb 3.2 oz (79.9 kg)    BMI (Calculated)  29.32    Single Leg Stand 10.2 seconds           Nutrition Therapy Plan and Nutrition Goals:  Nutrition Therapy & Goals - 02/13/20 0818      Nutrition Therapy   Diet heart healthy, low sodium    Drug/Food Interactions Statins/Certain Fruits     Protein (specify units) 60g    Fiber 25 grams    Whole Grain Foods 3 servings    Saturated Fats 12 max. grams    Fruits and Vegetables 5 servings/day   She does not like fruit or beans - ideally 8 fruits and vegetables   Sodium 1.5 grams      Personal Nutrition Goals   Nutrition Goal ST: Review paperwork LT: Re-incorporating grains (whole grains), getting a variety of vegetables (rainbow of plants), limiting red meat to 1-2x/week long term, adding in heart healthy snacks in between breakfast and lunch.    Comments She feels like she doesn't eat a lot - no fruit (including tomatoes), beans, or milk. Spinach, broccoli, collard greens, turnip greens, cabbage, onions, peppers. She reads her labels. Has IBS for which she has been taking medication for. Trying to limit bread and red meat. She has been eating chicken and is getting bored. B: 2 eggs - "I can't believe it's not butter" or olive oil with protein shake.  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 1/1:30 cooks dinner: thin ribeyes, chicken fajitas, chicken breast (uses olive oil and Ms. Dash and pepper) with vegetables on the side. S: no-sugar chocolate popsicles or rice cakes with peanut butter. She reports constipation with her medication - every two days (takes milk of magnesia). Drinks: water. She reports drinking Pedialyte which helped her go to the bathroom, discussed how that was likely the magnesium. Discussed how with IBS sugar free items sweetened with sugar alcohols could exacerbate her IBS. Discussed heart healthy eating and MyPlate as well as recommendations. She has already made many changes. Discussed incorporating whole grains, variety of vegetables, limiting red meat to 1-2x/week long term, adding in heart healthy snacks.      Intervention Plan   Intervention Prescribe, educate and counsel regarding individualized specific dietary modifications aiming towards targeted core components such as weight, hypertension, lipid  management, diabetes, heart failure and other comorbidities.;Nutrition handout(s) given to patient.    Expected Outcomes Short Term Goal: Understand basic principles of dietary content, such as calories, fat, sodium, cholesterol and nutrients.;Short Term Goal: A plan has been developed with personal nutrition goals set during dietitian appointment.;Long Term Goal: Adherence to prescribed nutrition plan.           Nutrition Assessments:  Nutrition Assessments - 01/26/20 1550      MEDFICTS Scores   Pre Score 9           MEDIFICTS Score Key:          ?70 Need to make dietary changes          40-70 Heart Healthy Diet         ? 40 Therapeutic Level Cholesterol Diet  Nutrition Goals Re-Evaluation:   Nutrition Goals Discharge (Final Nutrition Goals Re-Evaluation):   Psychosocial: Target Goals: Acknowledge presence or absence of significant depression and/or stress, maximize coping skills, provide positive support system. Participant is able to verbalize types and ability to use techniques and skills needed for reducing stress and depression.   Education: Depression - Provides group verbal and written instruction on the correlation between heart/lung disease and  depressed mood, treatment options, and the stigmas associated with seeking treatment.   Education: Sleep Hygiene -Provides group verbal and written instruction about how sleep can affect your health.  Define sleep hygiene, discuss sleep cycles and impact of sleep habits. Review good sleep hygiene tips.     Education: Stress and Anxiety: - Provides group verbal and written instruction about the health risks of elevated stress and causes of high stress.  Discuss the correlation between heart/lung disease and anxiety and treatment options. Review healthy ways to manage with stress and anxiety.    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 Yes   lives by self, 92 yo daughter lives within a couple miles     Barriers   Psychosocial barriers to participate in program Psychosocial barriers identified (see note);The patient should benefit from training in stress management and relaxation.      Screening Interventions   Interventions Encouraged to exercise    Expected Outcomes Short Term goal: Utilizing psychosocial counselor, staff and physician to assist with identification of specific Stressors or current issues interfering with healing process. Setting desired goal for each stressor or current issue identified.;Long Term Goal: Stressors or current issues are controlled or eliminated.;Short Term goal: Identification and review with participant of any Quality of Life or Depression concerns found by scoring the questionnaire.;Long Term goal: The participant improves quality of Life and PHQ9 Scores as seen by post scores and/or verbalization of changes           Quality of Life Scores:   Quality of Life - 01/26/20 1549      Quality of Life   Select Quality of Life      Quality of Life Scores   Health/Function Pre 16 %    Socioeconomic Pre 22.31 %    Psych/Spiritual Pre 25.71 %    Family Pre 25.2 %    GLOBAL Pre 20.7 %          Scores of 19 and below usually indicate a poorer quality of life in these areas.  A difference of  2-3 points is a clinically meaningful difference.  A difference of 2-3 points in the total score of the Quality of Life Index has been associated with significant improvement in overall quality of life, self-image, physical symptoms, and general health  in studies assessing change in quality of life.  PHQ-9: Recent Review Flowsheet Data    Depression screen [facility] 2/9 01/26/2020   Decreased Interest 0   Down, Depressed, Hopeless 0   PHQ - 2 Score 0   Altered sleeping 3    Tired, decreased energy 3   Change in appetite 0   Feeling bad or failure about yourself  0   Trouble concentrating 0   Moving slowly or fidgety/restless 0   Suicidal thoughts 0   PHQ-9 Score 6   Difficult doing work/chores Somewhat difficult     Interpretation of Total Score  Total Score Depression Severity:  1-4 = Minimal depression, 5-9 = Mild  depression, 10-14 = Moderate depression, 15-19 = Moderately severe depression, 20-27 = Severe depression   Psychosocial Evaluation and Intervention:  Psychosocial Evaluation - 01/24/20 1544      Psychosocial Evaluation & Interventions   Interventions Stress management education;Encouraged to exercise with the program and follow exercise prescription    Comments Hinda is coming into rehab after having an MI just before her birthday!  She lives alone, but has a daughter that lives just a couple of miles down the road.  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  She is currently scheduled to return on 10/28.  She is hoping to feel better by then.  She is looking forward to the program to help get her strength and steps back up to return to work.  She is also interested in talking with the dietitian about her eating.  She does not think that she is eating enough.  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  She does her best to keep moving each day.    Expected Outcomes Short: Attend rehab to rebuild strength for work  Long: Able to return to work without a problem.    Continue Psychosocial Services  Follow up required by staff           Psychosocial  Re-Evaluation:   Psychosocial Discharge (Final Psychosocial Re-Evaluation):   Vocational Rehabilitation: Provide vocational rehab assistance to qualifying candidates.   Vocational Rehab Evaluation & Intervention:  Vocational Rehab - 01/24/20 1538      Initial Vocational Rehab Evaluation & Intervention   Assessment shows need for Vocational Rehabilitation No   plans to return to work, needs to lift 70 lbs          Education: Education Goals: Education classes will be provided on a variety of topics geared toward better understanding of heart health and risk factor modification. Participant will state understanding/return demonstration of topics presented as noted by education test scores.  Learning Barriers/Preferences:  Learning Barriers/Preferences - 01/24/20 1537      Learning Barriers/Preferences   Learning Barriers Sight;Hearing   glasses, hearing aid in R ear   Learning Preferences None           General Cardiac Education Topics:  AED/CPR: - Group verbal and written instruction with the use of models to demonstrate the basic use of the AED with the basic ABC's of resuscitation.   Anatomy & Physiology of the Heart: - Group verbal and written instruction and models provide basic cardiac anatomy and physiology, with the coronary electrical and arterial systems. Review of Valvular disease and Heart Failure   Cardiac Procedures: - Group verbal and written instruction to review commonly prescribed medications for heart disease. Reviews the medication, class of the drug, and side effects. Includes the steps to properly store meds and maintain the prescription regimen. (beta blockers and nitrates)   Cardiac Medications I: - Group verbal and written instruction to review commonly prescribed medications for heart disease. Reviews the medication, class of the drug, and side effects. Includes the steps to properly store meds and maintain the prescription regimen.   Cardiac  Medications II: -Group verbal and written instruction to review commonly prescribed medications for heart disease. Reviews the medication, class of the drug, and side effects. (all other drug classes)    Go  Sex-Intimacy & Heart Disease, Get SMART - Goal Setting: - Group verbal and written instruction through game format to discuss heart disease and the return to sexual intimacy. Provides group verbal and written material to discuss and apply goal setting through the application of the S.M.A.R.T. Method.   Other Matters of the Heart: - Provides group verbal, written materials and models to describe Stable Angina and Peripheral Artery. Includes description of the disease process and treatment options available to the cardiac patient.   Infection Prevention: - Provides verbal and written material to individual with discussion of infection control including proper hand washing and proper equipment cleaning during exercise session.   Cardiac Rehab from 02/08/2020 in [facility] Cardiac and Pulmonary Rehab  Date 01/26/20  Educator AS  Instruction Review Code 1- Verbalizes Understanding      Falls Prevention: - Provides verbal and written material to individual with discussion of falls prevention and safety.   Cardiac Rehab from 02/08/2020 in [facility] Cardiac and Pulmonary Rehab  Date 01/26/20  Educator AS  Instruction Review Code 1- Verbalizes Understanding      Other: -Provides group and verbal instruction on various topics (see comments)   Knowledge Questionnaire Score:  Knowledge Questionnaire Score - 01/26/20 1550      Knowledge Questionnaire Score   Pre Score 23/26 exercise/depression           Core Components/Risk Factors/Patient Goals at Admission:  Personal Goals and Risk Factors at Admission - 01/26/20 1537      Core Components/Risk Factors/Patient Goals on Admission    Weight Management Yes    Intervention Weight Management: Develop a combined nutrition and exercise program  designed to reach desired caloric intake, while maintaining appropriate intake of nutrient and fiber, sodium and fats, and appropriate energy expenditure required for the weight goal.;Weight Management: Provide education and appropriate resources to help participant work on and attain dietary goals.    Admit Weight 173 lb (78.5 kg)    Goal Weight: Short Term 173 lb (78.5 kg)    Goal Weight: Long Term 173 lb (78.5 kg)    Expected Outcomes Short Term: Continue to assess and modify interventions until short term weight is achieved;Long Term: Adherence to nutrition and physical activity/exercise program aimed toward attainment of established weight goal;Weight Maintenance: Understanding of the daily nutrition guidelines, which includes 25-35% calories from fat, 7% or less cal from saturated fats, less than 262m cholesterol, less than 1.5gm of sodium, & 5 or more servings of fruits and vegetables daily;Understanding recommendations for meals to include 15-35% energy as protein, 25-35% energy from fat, 35-60% energy from carbohydrates, less than 2087mof dietary cholesterol, 20-35 gm of total fiber daily;Understanding of distribution of calorie intake throughout the day with the consumption of 4-5 meals/snacks    Hypertension Yes    Intervention Provide education on lifestyle modifcations including regular physical activity/exercise, weight management, moderate sodium restriction and increased consumption of fresh fruit, vegetables, and low fat dairy, alcohol moderation, and smoking cessation.;Monitor prescription use compliance.    Expected Outcomes Short Term: Continued assessment and intervention until BP is < 140/9085mG in hypertensive participants. < 130/74m31m in hypertensive participants with diabetes, heart failure or chronic kidney disease.;Long Term: Maintenance of blood pressure at goal levels.    Lipids Yes    Intervention Provide education and support for participant on nutrition &  aerobic/resistive exercise along with prescribed medications to achieve LDL <70mg6mL >40mg.34mExpected Outcomes Short Term: Participant states understanding of desired cholesterol values  and is compliant with medications prescribed. Participant is following exercise prescription and nutrition guidelines.;Long Term: Cholesterol controlled with medications as prescribed, with individualized exercise RX and with personalized nutrition plan. Value goals: LDL < 35m, HDL > 40 mg.           Education:Diabetes - Individual verbal and written instruction to review signs/symptoms of diabetes, desired ranges of glucose level fasting, after meals and with exercise. Acknowledge that pre and post exercise glucose checks will be done for 3 sessions at entry of program.   Education: Know Your Numbers and Risk Factors: -Group verbal and written instruction about important numbers in your health.  Discussion of what are risk factors and how they play a role in the disease process.  Review of Cholesterol, Blood Pressure, Diabetes, and BMI and the role they play in your overall health.   Core Components/Risk Factors/Patient Goals Review:    Core Components/Risk Factors/Patient Goals at Discharge (Final Review):    ITP Comments:  ITP Comments    Row Name 01/24/20 1549 01/30/20 0831         ITP Comments Completed virtual orientation today.  EP evaluation is scheduled for Thursday 01/26/20 at 1430.  [facility] First full day of exercise!  Patient was oriented to gym and equipment including functions, settings, policies, and procedures.  Patient's individual exercise prescription and treatment plan were reviewed.  All starting workloads were established based on the results of the 6 minute walk test done at initial orientation visit.  The plan for exercise progression was also introduced and progression will be customized based on patient's performance  and goals.             Comments: Discharge ITP

## 2020-02-15 NOTE — Progress Notes (Signed)
Daily Session Note  Patient Details  Name: Valerie Braun MRN: [id number] Date of Birth: 23-Dec-1965 Referring Provider:     Cardiac Rehab from 01/26/2020 in [facility] Cardiac and Pulmonary Rehab  Referring Provider [facility]      Encounter Date: 02/15/2020  Check In:  Session Check In - 02/15/20 0740      Check-In   Supervising physician immediately available to respond to emergencies See telemetry face sheet for immediately available ER MD    Location [facility]-Cardiac & Pulmonary Rehab    Staff Present Justin Mend RCP,RRT,BSRT;Amanda Oletta Darter, BA, ACSM CEP, Exercise Physiologist;Krista Frederico Hamman, RN BSN    Virtual Visit No    Medication changes reported     No    Fall or balance concerns reported    No    Warm-up and Cool-down Performed on first and last piece of equipment    Resistance Training Performed Yes    VAD Patient? No    PAD/SET Patient? No      Pain Assessment   Currently in Pain? No/denies              Social History   Tobacco Use  Smoking Status Never Smoker  Smokeless Tobacco Never Used    Goals Met:  Independence with exercise equipment Exercise tolerated well No report of cardiac concerns or symptoms Strength training completed today  Goals Unmet:  Not Applicable  Comments:  Valerie Braun graduated today from  rehab with 12 sessions completed.  Details of the patient's exercise prescription and what She needs to do in order to continue the prescription and progress were discussed with patient.  Patient was given a copy of prescription and goals.  Patient verbalized understanding.  Valerie Braun plans to continue to exercise by walk on her own.  Valerie Braun Name 01/26/20 1523 02/15/20 0742       6 Minute Walk   Phase Initial Discharge    Distance 1385 feet 1700 feet    Distance % Change -- 22.7 %    Distance Feet Change -- 315 ft    Walk Time 6 minutes 6 minutes    # of Rest Breaks 0 0    MPH 2.62 3.22    METS 3.75 4.54    RPE 9 13    Perceived Dyspnea   1 --    VO2 Peak 13.12 15.9    Symptoms No No    Resting HR 63 bpm 95 bpm    Resting BP 110/66 110/62    Resting Oxygen Saturation  97 % --    Exercise Oxygen Saturation  during 6 min walk 98 % --    Max Ex. HR 97 bpm 121 bpm    Max Ex. BP 134/74 138/64    2 Minute Post BP 122/76 --              Dr. Emily Filbert is Medical Director for [place] [facility].

## 2020-02-15 NOTE — Patient Instructions (Signed)
Discharge Patient Instructions  Patient Details  Name: Valerie Braun MRN: [id number] Date of Birth: Oct 08, 1965 Referring Provider:  Yolonda Kida, MD   Number of Visits: 12  Reason for Discharge:  Patient reached a stable level of exercise. Patient independent in their exercise. Patient has met program and personal goals. Early Exit:  Back to work  Smoking History:  Social History   Tobacco Use  Smoking Status Never Smoker  Smokeless Tobacco Never Used    Diagnosis:  NSTEMI (non-ST elevated myocardial infarction) ([place])  Initial Exercise Prescription:  Initial Exercise Prescription - 01/26/20 1500      Date of Initial Exercise RX and Referring Provider   Date 01/26/20    Referring Provider [facility]      Treadmill   MPH 2.6    Grade 2    Minutes 15    METs 3.75      Elliptical   Level 1    Speed 3    Minutes 15    METs 3.75      REL-XR   Level 3    Speed 50    Minutes 15    METs 3.75      Prescription Details   Frequency (times per week) 3    Duration Progress to 30 minutes of continuous aerobic without signs/symptoms of physical distress      Intensity   THRR 40-80% of Max Heartrate 104-144    Ratings of Perceived Exertion 11-15    Perceived Dyspnea 0-4      Resistance Training   Training Prescription Yes    Weight 4 lb    Reps 10-15           Discharge Exercise Prescription (Final Exercise Prescription Changes):  Exercise Prescription Changes - 02/01/20 0900      Response to Exercise   Blood Pressure (Admit) 110/62    Blood Pressure (Exercise) 152/70    Blood Pressure (Exit) 104/64    Heart Rate (Admit) 66 bpm    Heart Rate (Exercise) 136 bpm    Heart Rate (Exit) 84 bpm    Rating of Perceived Exertion (Exercise) 15    Symptoms none    Comments second full day of exercise    Duration Continue with 30 min of aerobic exercise without signs/symptoms of physical distress.    Intensity THRR unchanged      Progression   Progression  Continue to progress workloads to maintain intensity without signs/symptoms of physical distress.    Average METs 3.1      Resistance Training   Training Prescription Yes    Weight 4 lb    Reps 10-15      Interval Training   Interval Training No      Treadmill   MPH 2.6    Grade 2    Minutes 15    METs 3.71      Elliptical   Level 1    Speed 3    Minutes 15    METs 2.5      REL-XR   Level 3    Minutes 15           Functional Capacity:  [place] Name 01/26/20 1523 02/15/20 0742       6 Minute Walk   Phase Initial Discharge    Distance 1385 feet 1700 feet    Distance % Change -- 22.7 %    Distance Feet Change -- 315 ft    Walk Time  6 minutes 6 minutes    # of Rest Breaks 0 0    MPH 2.62 3.22    METS 3.75 4.54    RPE 9 13    Perceived Dyspnea  1 --    VO2 Peak 13.12 15.9    Symptoms No No    Resting HR 63 bpm 95 bpm    Resting BP 110/66 110/62    Resting Oxygen Saturation  97 % --    Exercise Oxygen Saturation  during 6 min walk 98 % --    Max Ex. HR 97 bpm 121 bpm    Max Ex. BP 134/74 138/64    2 Minute Post BP 122/76 --           Nutrition:  Nutrition Therapy & Goals - 02/13/20 0818      Nutrition Therapy   Diet heart healthy, low sodium    Drug/Food Interactions Statins/Certain Fruits    Protein (specify units) 60g    Fiber 25 grams    Whole Grain Foods 3 servings    Saturated Fats 12 max. grams    Fruits and Vegetables 5 servings/day   She does not like fruit or beans - ideally 8 fruits and vegetables   Sodium 1.5 grams      Personal Nutrition Goals   Nutrition Goal ST: Review paperwork LT: Re-incorporating grains (whole grains), getting a variety of vegetables (rainbow of plants), limiting red meat to 1-2x/week long term, adding in heart healthy snacks in between breakfast and lunch.    Comments She feels like she doesn't eat a lot - no fruit (including tomatoes), beans, or milk. Spinach, broccoli, collard greens, turnip greens,  cabbage, onions, peppers. She reads her labels. Has IBS for which she has been taking medication for. Trying to limit bread and red meat. She has been eating chicken and is getting bored. B: 2 eggs - "I can't believe it's not butter" or olive oil with protein shake.  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 1/1:30 cooks dinner: thin ribeyes, chicken fajitas, chicken breast (uses olive oil and Ms. Dash and pepper) with vegetables on the side. S: no-sugar chocolate popsicles or rice cakes with peanut butter. She reports constipation with her medication - every two days (takes milk of magnesia). Drinks: water. She reports drinking Pedialyte which helped her go to the bathroom, discussed how that was likely the magnesium. Discussed how with IBS sugar free items sweetened with sugar alcohols could exacerbate her IBS. Discussed heart healthy eating and MyPlate as well as recommendations. She has already made many changes. Discussed incorporating whole grains, variety of vegetables, limiting red meat to 1-2x/week long term, adding in heart healthy snacks.      Intervention Plan   Intervention Prescribe, educate and counsel regarding individualized specific dietary modifications aiming towards targeted core components such as weight, hypertension, lipid management, diabetes, heart failure and other comorbidities.;Nutrition handout(s) given to patient.    Expected Outcomes Short Term Goal: Understand basic principles of dietary content, such as calories, fat, sodium, cholesterol and nutrients.;Short Term Goal: A plan has been developed with personal nutrition goals set during dietitian appointment.;Long Term Goal: Adherence to prescribed nutrition plan.           Goals reviewed with patient; copy given to patient.

## 2020-02-16 ENCOUNTER — Ambulatory Visit
Admission: RE | Admit: 2020-02-16 | Discharge: 2020-02-16 | Disposition: A | Discharge status: Home / Self Care | Payer: Federal, State, Local not specified - PPO | Source: Ambulatory Visit | Attending: Internal Medicine | Admitting: Internal Medicine

## 2020-02-16 ENCOUNTER — Other Ambulatory Visit: Payer: Self-pay

## 2020-02-16 DIAGNOSIS — I219 Acute myocardial infarction, unspecified: Secondary | ICD-10-CM | POA: Insufficient documentation

## 2020-02-16 MED ORDER — REGADENOSON 0.4 MG/5ML IV SOLN
0.4000 mg | Freq: Once | INTRAVENOUS | Status: AC
  Administered 2020-02-16: 0.4 mg via INTRAVENOUS
  Filled 2020-02-16: qty 5

## 2020-02-16 MED ORDER — TECHNETIUM TC 99M TETROFOSMIN IV KIT
10.0000 | PACK | Freq: Once | INTRAVENOUS | Status: AC | PRN
  Administered 2020-02-16: 9.87 via INTRAVENOUS

## 2020-02-16 MED ORDER — TECHNETIUM TC 99M TETROFOSMIN IV KIT
31.8500 | PACK | Freq: Once | INTRAVENOUS | Status: AC | PRN
  Administered 2020-02-16: 31.85 via INTRAVENOUS

## 2020-02-17 LAB — NM MYOCAR MULTI W/SPECT W/WALL MOTION / EF
Estimated workload: 1 METS
Exercise duration (sec): 55 s
LV dias vol: 69 mL (ref 46–106)
LV sys vol: 32 mL
MPHR: 165 {beats}/min
Peak HR: 110 {beats}/min
Percent HR: 66 %
Rest HR: 55 {beats}/min
SDS: 1
SRS: 3
SSS: 2
TID: 0.89

## 2020-04-11 ENCOUNTER — Other Ambulatory Visit: Payer: Self-pay | Admitting: Physician Assistant

## 2020-04-11 DIAGNOSIS — Z1231 Encounter for screening mammogram for malignant neoplasm of breast: Secondary | ICD-10-CM

## 2020-04-18 ENCOUNTER — Other Ambulatory Visit: Payer: Self-pay

## 2020-04-18 ENCOUNTER — Ambulatory Visit
Admission: RE | Admit: 2020-04-18 | Discharge: 2020-04-18 | Disposition: A | Discharge status: Home / Self Care | Payer: Federal, State, Local not specified - PPO | Source: Ambulatory Visit | Attending: Physician Assistant | Admitting: Physician Assistant

## 2020-04-18 DIAGNOSIS — Z1231 Encounter for screening mammogram for malignant neoplasm of breast: Secondary | ICD-10-CM | POA: Insufficient documentation

## 2020-08-08 ENCOUNTER — Other Ambulatory Visit (HOSPITAL_COMMUNITY): Payer: Self-pay | Admitting: Physician Assistant

## 2020-08-08 ENCOUNTER — Other Ambulatory Visit: Payer: Self-pay | Admitting: Physician Assistant

## 2020-08-08 DIAGNOSIS — I10 Essential (primary) hypertension: Secondary | ICD-10-CM

## 2020-08-08 DIAGNOSIS — D3501 Benign neoplasm of right adrenal gland: Secondary | ICD-10-CM

## 2020-08-24 ENCOUNTER — Other Ambulatory Visit: Payer: Self-pay

## 2020-08-24 ENCOUNTER — Ambulatory Visit
Admission: RE | Admit: 2020-08-24 | Discharge: 2020-08-24 | Disposition: A | Discharge status: Home / Self Care | Payer: Federal, State, Local not specified - PPO | Source: Ambulatory Visit | Attending: Physician Assistant | Admitting: Physician Assistant

## 2020-08-24 DIAGNOSIS — D3501 Benign neoplasm of right adrenal gland: Secondary | ICD-10-CM | POA: Insufficient documentation

## 2020-08-24 DIAGNOSIS — I10 Essential (primary) hypertension: Secondary | ICD-10-CM | POA: Diagnosis present

## 2020-10-02 ENCOUNTER — Other Ambulatory Visit: Payer: Self-pay

## 2020-10-02 ENCOUNTER — Emergency Department: Payer: Federal, State, Local not specified - PPO

## 2020-10-02 ENCOUNTER — Emergency Department
Admission: EM | Admit: 2020-10-02 | Discharge: 2020-10-02 | Disposition: A | Discharge status: Home / Self Care | Payer: Federal, State, Local not specified - PPO | Attending: Emergency Medicine | Admitting: Emergency Medicine

## 2020-10-02 DIAGNOSIS — Z79899 Other long term (current) drug therapy: Secondary | ICD-10-CM | POA: Insufficient documentation

## 2020-10-02 DIAGNOSIS — Z7982 Long term (current) use of aspirin: Secondary | ICD-10-CM | POA: Insufficient documentation

## 2020-10-02 DIAGNOSIS — R079 Chest pain, unspecified: Secondary | ICD-10-CM | POA: Diagnosis present

## 2020-10-02 DIAGNOSIS — I2510 Atherosclerotic heart disease of native coronary artery without angina pectoris: Secondary | ICD-10-CM | POA: Insufficient documentation

## 2020-10-02 DIAGNOSIS — I10 Essential (primary) hypertension: Secondary | ICD-10-CM | POA: Insufficient documentation

## 2020-10-02 LAB — CBC
HCT: 41.3 % (ref 36.0–46.0)
Hemoglobin: 14 g/dL (ref 12.0–15.0)
MCH: 31.7 pg (ref 26.0–34.0)
MCHC: 33.9 g/dL (ref 30.0–36.0)
MCV: 93.4 fL (ref 80.0–100.0)
Platelets: 262 10*3/uL (ref 150–400)
RBC: 4.42 MIL/uL (ref 3.87–5.11)
RDW: 13.2 % (ref 11.5–15.5)
WBC: 9 10*3/uL (ref 4.0–10.5)
nRBC: 0 % (ref 0.0–0.2)

## 2020-10-02 LAB — BASIC METABOLIC PANEL
Anion gap: 6 (ref 5–15)
BUN: 14 mg/dL (ref 6–20)
CO2: 27 mmol/L (ref 22–32)
Calcium: 9.2 mg/dL (ref 8.9–10.3)
Chloride: 110 mmol/L (ref 98–111)
Creatinine, Ser: 0.79 mg/dL (ref 0.44–1.00)
GFR, Estimated: >60 mL/min (ref >60)
Glucose, Bld: 102 mg/dL — ABNORMAL HIGH (ref 70–99)
Potassium: 3.8 mmol/L (ref 3.5–5.1)
Sodium: 143 mmol/L (ref 135–145)

## 2020-10-02 LAB — TROPONIN I (HIGH SENSITIVITY)
Troponin I (High Sensitivity): 2 ng/L (ref <18)
Troponin I (High Sensitivity): 3 ng/L (ref <18)

## 2020-10-02 NOTE — ED Notes (Signed)
ED Provider at bedside. 

## 2020-10-02 NOTE — ED Notes (Signed)
Patient provided discharge instructions, including follow up care. Patient verbalized understanding of all instructions, and denies questions. Patient discharged to POV.

## 2020-10-02 NOTE — ED Notes (Signed)
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 Patient reports pain worsened with movement. Patient reports recent hx of MI, and reports wanting to be evaluated due to that. Patient reports chest pain lasted a few hours, but has since resolved. Patient denies nausea, but reports some mild SOB with the initiation of the chest pain. Patient denies symptoms at this time.

## 2020-10-02 NOTE — ED Provider Notes (Signed)
[facility] Emergency Department Provider Note   ____________________________________________   Event Date/Time   First MD Initiated Contact with Patient 10/02/20 [phone]     (approximate)  I have reviewed the triage vital signs and the nursing notes.   HISTORY  Chief Complaint Chest Pain    HPI Valerie Braun is a 55 y.o. female with past medical history of hypertension, hyperlipidemia, and CAD who presents to the ED complaining of chest pain.  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  Pain lasted for a about 2 hours before resolving, was not associated with any shortness of breath, nausea, vomiting, or dizziness.  She states that since she arrived in the ED the pain has resolved and she now feels back to normal.  She is concerned because she had an MI in September of last year, but states she had a stress test that was normal in April of this year.  She has been feeling well recently with no fevers, cough, abdominal pain.        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 Authorizing Provider  aspirin EC 81 MG EC tablet Take 1 tablet (81 mg total) by mouth daily. Swallow whole. 01/13/20   Ezekiel Slocumb, DO  atorvastatin (LIPITOR) 80 MG tablet Take 1 tablet (80 mg total) by mouth at bedtime. 01/12/20   Ezekiel Slocumb, DO  Calcium Carb-Cholecalciferol (CALCIUM-VITAMIN D) 500-200 MG-UNIT tablet Take 1 tablet by mouth daily.     [provider]  esomeprazole (NEXIUM) 40 MG capsule 1 capsule once daily. 08/30/14   [provider]  ibuprofen (ADVIL,MOTRIN) 200 MG tablet Take 200 mg by mouth every 6 (six) hours as needed.     [provider]  Linaclotide Rolan Lipa) 145 MCG CAPS capsule Take by mouth.    [provider]  metoprolol succinate (TOPROL-XL) 100 MG 24 hr tablet Take 1 tablet (100 mg total) by mouth daily. Take with or immediately following a meal. 01/13/20   Nicole Kindred A, DO  metoprolol succinate (TOPROL-XL) 50 MG 24 hr tablet Take by mouth. 01/17/20   [provider]  Multiple Vitamin (MULTI-VITAMINS) TABS Take 1 tablet by mouth daily.     [provider]  nitroGLYCERIN (NITROSTAT) 0.4 MG SL tablet  Place 1 tablet (0.4 mg total) under the tongue every 5 (five) minutes as needed for chest pain. 01/12/20   Ezekiel Slocumb, DO  Omega-3 Fatty Acids (FISH OIL) 1000 MG CAPS Take by mouth.    [provider]  sacubitril-valsartan (ENTRESTO) 24-26 MG Take 1 tablet by mouth 2 (two) times daily. 01/12/20   Ezekiel Slocumb, DO  spironolactone (ALDACTONE) 25 MG tablet Take 1 tablet (25 mg total) by mouth daily. 01/13/20   Ezekiel Slocumb, DO  ticagrelor (BRILINTA) 90 MG TABS tablet Take 1 tablet (90 mg total) by mouth 2 (two) times daily. 01/12/20    Ezekiel Slocumb, DO    Allergies Other  Family History  Adopted: Yes  Problem Relation Age of Onset   Prostate cancer Maternal Uncle    Tuberculosis Maternal Grandfather    Hypertension Mother    Anemia Mother    Breast cancer Neg Hx     Social History Social History   Tobacco Use   Smoking status: Never   Smokeless tobacco: Never  Substance Use Topics   Alcohol use: No    Alcohol/week: 0.0 standard drinks   Drug use: No    Review of Systems  Constitutional: No fever/chills Eyes: No visual changes. ENT: No sore throat. Cardiovascular: Positive for chest pain. Respiratory: Denies shortness of breath. Gastrointestinal: No abdominal pain.  No nausea, no vomiting.  No diarrhea.  No constipation. Genitourinary: Negative for dysuria. Musculoskeletal: Negative for back pain. Skin: Negative for rash. Neurological: Negative for headaches, focal weakness or numbness.  ____________________________________________   PHYSICAL EXAM:  VITAL SIGNS: ED Triage Vitals  Enc Vitals Group     BP 10/02/20 1449 134/82     Pulse Rate 10/02/20 1449 61     Resp 10/02/20 1449 16     Temp 10/02/20 1449 98.6 F (37 C)     Temp Source 10/02/20 1449 Oral     SpO2 10/02/20 1449 99 %     Weight --      Height --      Head Circumference --      Peak Flow --      Pain Score 10/02/20 1444 4     Pain Loc --      Pain Edu? --      Excl. in [place]? --     Constitutional: Alert and oriented. Eyes: Conjunctivae are normal. Head: Atraumatic. Nose: No congestion/rhinnorhea. Mouth/Throat: Mucous membranes are moist. Neck: Normal ROM Cardiovascular: Normal rate, regular rhythm. Grossly normal heart sounds.  2+ radial pulses bilaterally. Respiratory: Normal respiratory effort.  No retractions. Lungs CTAB.  No chest wall tenderness to palpation. Gastrointestinal: Soft and nontender. No distention. Genitourinary: deferred Musculoskeletal: No lower extremity tenderness nor edema. Neurologic:   Normal speech and language. No gross focal neurologic deficits are appreciated. Skin:  Skin is warm, dry and intact. No rash noted. Psychiatric: Mood and affect are normal. Speech and behavior are normal.  ____________________________________________   LABS (all labs ordered are listed, but only abnormal results are displayed)  Labs Reviewed  BASIC METABOLIC PANEL - Abnormal; Notable for the following components:      Result Value   Glucose, Bld 102 (*)    All other components within normal limits  CBC  TROPONIN I (HIGH SENSITIVITY)  TROPONIN I (HIGH SENSITIVITY)   ____________________________________________  EKG  ED ECG REPORT I, Blake Divine, the attending physician, personally viewed and interpreted this ECG.   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  EKG shows no evidence of arrhythmia, does show borderline ST changes inferiorly but overall appears similar to previous.  Initial troponin is negative and patient remains chest pain-free at this time.  We will observe on cardiac monitor and trend troponin.  Chest x-ray reviewed by me with no infiltrate, edema, or effusion.  Repeat troponin within normal limits.  I was able to confirm with [facility] cardiology that patient stress testing in April was negative.  She remains chest pain-free here in the ED and is appropriate for discharge home with outpatient cardiology follow-up.  She was counseled to return to the ED for new or worsening symptoms, patient agrees to plan.       ____________________________________________   FINAL CLINICAL IMPRESSION(S) / ED DIAGNOSES  Final diagnoses:  Nonspecific chest pain     ED Discharge Orders     None        Note:  This document was prepared using Dragon voice recognition software and may include unintentional dictation errors.    Blake Divine, MD 10/02/20 [phone]

## 2020-10-02 NOTE — ED Triage Notes (Signed)
Pt sent from [facility] with c/o mild central chest heaviness while at work for the past 3-4hrs, states it started while at work today.

## 2021-02-11 ENCOUNTER — Other Ambulatory Visit: Payer: Self-pay | Admitting: Physician Assistant

## 2021-02-11 DIAGNOSIS — Z1231 Encounter for screening mammogram for malignant neoplasm of breast: Secondary | ICD-10-CM

## 2021-04-19 ENCOUNTER — Ambulatory Visit
Admission: RE | Admit: 2021-04-19 | Discharge: 2021-04-19 | Disposition: A | Discharge status: Home / Self Care | Payer: Federal, State, Local not specified - PPO | Source: Ambulatory Visit | Attending: Physician Assistant | Admitting: Physician Assistant

## 2021-04-19 ENCOUNTER — Other Ambulatory Visit: Payer: Self-pay

## 2021-04-19 DIAGNOSIS — Z1231 Encounter for screening mammogram for malignant neoplasm of breast: Secondary | ICD-10-CM | POA: Diagnosis not present

## 2021-07-22 ENCOUNTER — Other Ambulatory Visit: Payer: Self-pay | Admitting: Student

## 2021-07-22 DIAGNOSIS — M7541 Impingement syndrome of right shoulder: Secondary | ICD-10-CM

## 2021-07-22 DIAGNOSIS — M7581 Other shoulder lesions, right shoulder: Secondary | ICD-10-CM

## 2021-07-22 DIAGNOSIS — M7521 Bicipital tendinitis, right shoulder: Secondary | ICD-10-CM

## 2021-07-31 ENCOUNTER — Ambulatory Visit
Admission: RE | Admit: 2021-07-31 | Discharge: 2021-07-31 | Disposition: A | Discharge status: Home / Self Care | Payer: Federal, State, Local not specified - PPO | Source: Ambulatory Visit | Attending: Student | Admitting: Student

## 2021-07-31 DIAGNOSIS — M7521 Bicipital tendinitis, right shoulder: Secondary | ICD-10-CM

## 2021-07-31 DIAGNOSIS — M7581 Other shoulder lesions, right shoulder: Secondary | ICD-10-CM

## 2021-07-31 DIAGNOSIS — M7541 Impingement syndrome of right shoulder: Secondary | ICD-10-CM

## 2021-08-16 ENCOUNTER — Other Ambulatory Visit: Payer: Self-pay | Admitting: Surgery

## 2021-08-27 ENCOUNTER — Other Ambulatory Visit: Payer: Self-pay

## 2021-08-27 ENCOUNTER — Other Ambulatory Visit
Admission: RE | Admit: 2021-08-27 | Discharge: 2021-08-27 | Disposition: A | Discharge status: Home / Self Care | Payer: Federal, State, Local not specified - PPO | Source: Ambulatory Visit | Attending: Surgery | Admitting: Surgery

## 2021-08-27 HISTORY — DX: Gastro-esophageal reflux disease without esophagitis: K21.9

## 2021-08-27 HISTORY — DX: Acute myocardial infarction, unspecified: I21.9

## 2021-08-27 HISTORY — DX: Personal history of urinary calculi: Z87.442

## 2021-08-27 HISTORY — DX: Dyspnea, unspecified: R06.00

## 2021-08-27 HISTORY — DX: Prediabetes: R73.03

## 2021-08-27 HISTORY — DX: Atherosclerotic heart disease of native coronary artery without angina pectoris: I25.10

## 2021-08-27 HISTORY — DX: Unspecified osteoarthritis, unspecified site: M19.90

## 2021-08-27 HISTORY — DX: Essential (primary) hypertension: I10

## 2021-08-27 NOTE — Patient Instructions (Addendum)
Your procedure is scheduled on: 09/03/21 - Tuesday ?Report to the Registration Desk on the 1st floor of the [place]. ?To find out your arrival time, please call [phone] between 1PM - 3PM on: 09/02/21 - Monday ?If your arrival time is 6:00 am, do not arrive prior to that time as the [place] entrance doors do not open until 6:00 am. ? ?REMEMBER: ?Instructions that are not followed completely may result in serious medical risk, up to and including death; or upon the discretion of your surgeon and anesthesiologist your surgery may need to be rescheduled. ? ?Do not eat food after midnight the night before surgery.  ?No gum chewing, lozengers or hard candies. ? ?You may however, drink CLEAR liquids up to 2 hours before you are scheduled to arrive for your surgery. Do not drink anything within 2 hours of your scheduled arrival time. ? ?Clear liquids include: ?- water  ?- apple juice without pulp ?- gatorade (not RED colors) ?- black coffee or tea (Do NOT add milk or creamers to the coffee or tea) ?Do NOT drink anything that is not on this list. ? ?Type 1 and Type 2 diabetics should only drink water. ? ?In addition, your doctor has ordered for you to drink the provided  ?Gatorade G2 ?Drinking this carbohydrate drink up to two hours before surgery helps to reduce insulin resistance and improve patient outcomes. Please complete drinking 2 hours prior to scheduled arrival time. ? ?TAKE THESE MEDICATIONS THE MORNING OF SURGERY WITH A SIP OF WATER: ? ?- amLODipine (NORVASC) 5 MG tablet ?- esomeprazole (NEXIUM) 40 MG capsule, (take one the night before and one on the morning of surgery - helps to prevent nausea after surgery.) ?- icosapent Ethyl (VASCEPA) 1 g capsule ?- metoprolol succinate (TOPROL-XL) 50 MG 24 hr tablet ? ?  ?- Stop taking beginning 05/13/ 23 - empagliflozin (JARDIANCE) 25 MG TABS tablet, you may resume the day after surgery. ? ?One week prior to surgery: ?Stop Anti-inflammatories (NSAIDS) such  as Advil, Aleve, Ibuprofen, Motrin, Naproxen, Naprosyn and Aspirin based products such as Excedrin, Goodys Powder, BC Powder. ? ?Stop ANY OVER THE COUNTER supplements until after surgery. ? ?You may however, continue to take Tylenol if needed for pain up until the day of surgery. ? ?No Alcohol for 24 hours before or after surgery. ? ?No Smoking including e-cigarettes for 24 hours prior to surgery.  ?No chewable tobacco products for at least 6 hours prior to surgery.  ?No nicotine patches on the day of surgery. ? ?Do not use any "recreational" drugs for at least a week prior to your surgery.  ?Please be advised that the combination of cocaine and anesthesia may have negative outcomes, up to and including death. ?If you test positive for cocaine, your surgery will be cancelled. ? ?On the morning of surgery brush your teeth with toothpaste and water, you may rinse your mouth with mouthwash if you wish. ?Do not swallow any toothpaste or mouthwash. ? ?Use CHG Soap or wipes as directed on instruction sheet. ? ?Do not wear jewelry, make-up, hairpins, clips or nail polish. ? ?Do not wear lotions, powders, or perfumes.  ? ?Do not shave body from the neck down 48 hours prior to surgery just in case you cut yourself which could leave a site for infection.  ?Also, freshly shaved skin may become irritated if using the CHG soap. ? ?Contact lenses, hearing aids and dentures may not be worn into surgery. ? ?Do not bring valuables  to the hospital. [facility] is not responsible for any missing/lost belongings or valuables.  ? ?Notify your doctor if there is any change in your medical condition (cold, fever, infection). ? ?Wear comfortable clothing (specific to your surgery type) to the hospital. ? ?After surgery, you can help prevent lung complications by doing breathing exercises.  ?Take deep breaths and cough every 1-2 hours. Your doctor may order a device called an Incentive Spirometer to help you take deep breaths. ?When  coughing or sneezing, hold a pillow firmly against your incision with both hands. This is called ?splinting.? Doing this helps protect your incision. It also decreases belly discomfort. ? ?If you are being admitted to the hospital overnight, leave your suitcase in the car. ?After surgery it may be brought to your room. ? ?If you are being discharged the day of surgery, you will not be allowed to drive home. ?You will need a responsible adult (18 years or older) to drive you home and stay with you that night.  ? ?If you are taking public transportation, you will need to have a responsible adult (18 years or older) with you. ?Please confirm with your physician that it is acceptable to use public transportation.  ? ?Please call the [place] Dept. at [phone] if you have any questions about these instructions. ? ?Surgery Visitation Policy: ? ?Patients undergoing a surgery or procedure may have two family members or support persons with them as long as the person is not COVID-19 positive or experiencing its symptoms.  ? ?Inpatient Visitation:   ? ?Visiting hours are 7 a.m. to 8 p.m. ?Up to four visitors are allowed at one time in a patient room, including children. The visitors may rotate out with other people during the day. One designated support person (adult) may remain overnight.  ?

## 2021-08-29 ENCOUNTER — Encounter: Payer: Self-pay | Admitting: Surgery

## 2021-08-29 NOTE — Progress Notes (Signed)
?Perioperative Services ? ?Pre-Admission/Anesthesia Testing Clinical Review ? ?Date: 08/29/21 ? ?Patient Demographics:  ?Name: Valerie Braun ?DOB:   1965/07/13 ?MRN:   [id number] ? ?Planned Surgical Procedure(s):  ? ? Case: [id number] Date/Time: 09/03/21 1339  ? Procedures:  ?    RIGHT SHOULDER ARTHROSCOPY WITH DEBRIDEMENT AND DECOMPRESSION, ROTATOR CUFF REPAIR, AND BICEPS TENODESIS (Right: Shoulder) ?    CARPAL TUNNEL RELEASE ENDOSCOPIC (Right: Shoulder)  ? Anesthesia type: Choice  ? Pre-op diagnosis:  ?    Right carpal tunnel syndrome G56.01 ?    Complete tear of right rotator cuff, unspecified whether traumatic M75.121 ?    Right rotator cuff tendonitis M75.81 ?    Biceps tendonitis on right M75.21  ? Location: [facility] OR ROOM 03 / [facility]  ? Surgeons: Corky Mull, MD  ? ?NOTE: Available PAT nursing documentation and vital signs have been reviewed. Clinical nursing staff has updated patient's PMH/PSHx, current medication list, and drug allergies/intolerances to ensure comprehensive history available to assist in medical decision making as it pertains to the aforementioned surgical procedure and anticipated anesthetic course. Extensive review of available clinical information performed. [place] [facility] and PSHx updated with any diagnoses/procedures that  may have been inadvertently omitted during her intake with the pre-admission testing department's nursing staff. ? ?Clinical Discussion:  ?Valerie Braun is a 56 y.o. female who is submitted for pre-surgical anesthesia review and clearance prior to her undergoing the above procedure. Patient has never been a smoker. Pertinent PMH includes: CAD, NSTEMI, diastolic dysfunction, HTN, HLD, prediabetes, dyspnea, OSAH (requires nocturnal PAP therapy), GERD (uses CaCO3 tablets as needed), OA. ? ?Patient is followed by cardiology Edwin Dada, MD). She was last seen in the cardiology clinic on 08/14/2021; notes reviewed.  At the time of her clinic visit,  patient denied any episodes of chest pain.  She had chronic exertional dyspnea and lower extremity edema.  She denied any PND, orthopnea, palpitations, vertiginous symptoms, or presyncope/syncope.  Patient with a past medical history significant for cardiovascular diagnoses. ? ?Patient suffered an NSTEMI on 01/09/2020.  Diagnostic left heart catheterization was performed on 01/10/2020 revealing a mildly reduced left ventricular systolic function with an EF of 45%.  There were 100% occlusions of both the distal LAD and second diagonal.  Cardiologist reviewed findings with [facility], as patient was felt to be high risk for definitive intervention with PCI.  Given that collaterals had already formed, the decision was made to defer PCI opting for aggressive medical management. ? ?Stress echocardiogram was performed on 08/16/2021 revealing a normal left ventricular systolic function with an EF of >55%.  There were no regional wall motion abnormalities.  Trivial mitral valve and moderate tricuspid valve regurgitation noted.  Patient able to complete 10.30 METS of activity without angina/anginal equivalent symptoms. ? ?Blood pressure well controlled at 117/67 on currently prescribed CCB, ARB, diuretic, and beta-blocker therapies.  Patient is on a statin + icosapent ethyl for her HLD and further ASCVD prevention.  Patient has a supply of SL nitroglycerin to use on a as needed basis; denied recent use.  Patient with a diagnosis of prediabetes; last HgbA1c was 6.0% when checked on 08/08/2021.  She is on a SGLT2i for her pre-diabetes diagnosis in the setting of known cardiovascular diagnoses. Patient remains active; works as a Development worker, community carrier and does between 12,000-22,000 steps per day. Functional capacity, as defined by [facility]AS[facility], is documented as being >/= 4 METS.  No changes were made to her medication regimen.  Patient  to follow-up with outpatient cardiology in 6 weeks as already planned to follow-up with primary  cardiologist. ? ?Valerie Braun is scheduled for an elective RIGHT SHOULDER ARTHROSCOPY WITH DEBRIDEMENT AND DECOMPRESSION, ROTATOR CUFF REPAIR, BICEPS TENODESIS, AND CARPAL TUNNEL RELEASE ENDOSCOPIC on 09/03/2021 with Dr. Milagros Evener, MD. Given patient's past medical history significant for cardiovascular diagnoses, presurgical cardiac clearance was sought by the PAT team. Per cardiology, "this patient is optimized for surgery and may proceed with the planned procedural course with a MODERATE risk of significant perioperative cardiovascular complications".  In review of her medication reconciliation, it is noted the patient is on daily antiplatelet therapy. She has been instructed on recommendations from her cardiologist for continuing her daily low-dose ASA throughout her perioperative course. ? ?Patient denies previous perioperative complications with anesthesia in the past. In review of the EMR, there are no records available for review regarding patient's past surgical/anesthetic courses within the [facility] system. ? ? ?  10/02/2020  ?  6:44 PM 10/02/2020  ?  4:30 PM 10/02/2020  ?  4:10 PM  ?Vitals with BMI  ?Height   '5\' 5"'$   ?Weight   176 lbs 6 oz  ?BMI   29.35  ?Systolic 144 818   ?Diastolic 80 88   ?Pulse 62 66   ? ? ?Providers/Specialists:  ? ?NOTE: Primary physician provider listed below. Patient may have been seen by APP or partner within same practice.  ? ?PROVIDER ROLE / SPECIALTY LAST OV  ?Poggi, Marshall Cork, MD Orthopedics (Surgeon) 08/05/2021  ?Marinda Elk, MD Primary Care Provider 08/15/2021  ?Cloretta Ned, MD Cardiology 08/14/2021  ? ?Allergies:  ?Other ? ?Current Home Medications:  ? ?No current facility-administered medications for this encounter.  ? ? amLODipine (NORVASC) 5 MG tablet  ? aspirin EC 81 MG EC tablet  ? Calcium Carb-Cholecalciferol (CALCIUM-VITAMIN D) 500-200 MG-UNIT tablet  ? Coenzyme Q10 100 MG capsule  ? esomeprazole (NEXIUM) 40 MG capsule  ? ezetimibe (ZETIA) 10 MG tablet   ? fluticasone (FLONASE) 50 MCG/ACT nasal spray  ? Ginkgo Biloba 120 MG TABS  ? Ibuprofen-diphenhydrAMINE HCl (ADVIL PM) 200-25 MG CAPS  ? icosapent Ethyl (VASCEPA) 1 g capsule  ? linaclotide (LINZESS) 145 MCG CAPS capsule  ? losartan (COZAAR) 50 MG tablet  ? metoprolol succinate (TOPROL-XL) 50 MG 24 hr tablet  ? Multiple Vitamin (MULTI-VITAMINS) TABS  ? nitroGLYCERIN (NITROSTAT) 0.4 MG SL tablet  ? Olopatadine HCl 0.6 % SOLN  ? rosuvastatin (CRESTOR) 5 MG tablet  ? spironolactone (ALDACTONE) 25 MG tablet  ? atorvastatin (LIPITOR) 80 MG tablet  ? empagliflozin (JARDIANCE) 25 MG TABS tablet  ? metoprolol succinate (TOPROL-XL) 100 MG 24 hr tablet  ? sacubitril-valsartan (ENTRESTO) 24-26 MG  ? ticagrelor (BRILINTA) 90 MG TABS tablet  ? ?History:  ? ?Past Medical History:  ?Diagnosis Date  ? 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 Arthritis   ? Avitaminosis D 08/25/2013  ? Coronary artery disease 01/10/2020  ? a.) LHC 01/10/2020: EF 45%; anteroapical HK; 100% dLAD, 100% D2; reviewed with [facility] d/t HIGH RISK PCI - collaterals already formed, thus intervention deferred opting for med mgmt.  ? Diastolic dysfunction   ? a.) TTE 01/09/2020: EF 55-60%; mild BAE; mild MR; G1DD.  ? Dyspnea   ? Fatigue 08/25/2013  ? GERD (gastroesophageal reflux disease)   ? History of kidney stones   ? Hypertension   ? NSTEMI (non-ST elevated myocardial infarction) ([place]) 01/09/2020  ? a.) LHC 01/10/2020: EF 45%; 100% dLAD and 100% D2; collaterals  present, thus intervention deffered opting for medical mgmt.  ? OSA on CPAP   ? Pre-diabetes   ? ?Past Surgical History:  ?Procedure Laterality Date  ? COLONOSCOPY    ? LEFT HEART CATH AND CORONARY ANGIOGRAPHY N/A 01/10/2020  ? Procedure: LEFT HEART CATH AND CORONARY ANGIOGRAPHY;  Surgeon: Dionisio David, MD;  Location: [facility];  Service: Cardiovascular;  Laterality: N/A;  ? TUBAL LIGATION    ? ?Family History  ?Adopted: Yes  ?Problem Relation Age of Onset  ? Prostate cancer Maternal Uncle   ?  Tuberculosis Maternal Grandfather   ? Hypertension Mother   ? Anemia Mother   ? Breast cancer Neg Hx   ? ?Social History  ? ?Tobacco Use  ? Smoking status: Never  ? Smokeless tobacco: Never  ?Substance Use Topics  ? Alcohol use

## 2021-09-03 ENCOUNTER — Encounter
Admission: RE | Disposition: A | Discharge status: Home / Self Care | Payer: Self-pay | Source: Home / Self Care | Attending: Surgery

## 2021-09-03 ENCOUNTER — Ambulatory Visit: Payer: Federal, State, Local not specified - PPO | Admitting: Urgent Care

## 2021-09-03 ENCOUNTER — Other Ambulatory Visit: Payer: Self-pay

## 2021-09-03 ENCOUNTER — Encounter: Payer: Self-pay | Admitting: Surgery

## 2021-09-03 ENCOUNTER — Ambulatory Visit: Payer: Federal, State, Local not specified - PPO

## 2021-09-03 ENCOUNTER — Ambulatory Visit
Admission: RE | Admit: 2021-09-03 | Discharge: 2021-09-03 | Disposition: A | Discharge status: Home / Self Care | Payer: Federal, State, Local not specified - PPO | Attending: Surgery | Admitting: Surgery

## 2021-09-03 DIAGNOSIS — M75121 Complete rotator cuff tear or rupture of right shoulder, not specified as traumatic: Secondary | ICD-10-CM | POA: Diagnosis not present

## 2021-09-03 DIAGNOSIS — K219 Gastro-esophageal reflux disease without esophagitis: Secondary | ICD-10-CM | POA: Diagnosis not present

## 2021-09-03 DIAGNOSIS — G5601 Carpal tunnel syndrome, right upper limb: Secondary | ICD-10-CM | POA: Insufficient documentation

## 2021-09-03 DIAGNOSIS — M7521 Bicipital tendinitis, right shoulder: Secondary | ICD-10-CM | POA: Diagnosis not present

## 2021-09-03 DIAGNOSIS — Z7982 Long term (current) use of aspirin: Secondary | ICD-10-CM | POA: Diagnosis not present

## 2021-09-03 DIAGNOSIS — I252 Old myocardial infarction: Secondary | ICD-10-CM | POA: Diagnosis not present

## 2021-09-03 DIAGNOSIS — I2510 Atherosclerotic heart disease of native coronary artery without angina pectoris: Secondary | ICD-10-CM | POA: Insufficient documentation

## 2021-09-03 DIAGNOSIS — R7303 Prediabetes: Secondary | ICD-10-CM | POA: Insufficient documentation

## 2021-09-03 DIAGNOSIS — I10 Essential (primary) hypertension: Secondary | ICD-10-CM | POA: Insufficient documentation

## 2021-09-03 DIAGNOSIS — M25811 Other specified joint disorders, right shoulder: Secondary | ICD-10-CM | POA: Diagnosis not present

## 2021-09-03 DIAGNOSIS — G4730 Sleep apnea, unspecified: Secondary | ICD-10-CM | POA: Insufficient documentation

## 2021-09-03 HISTORY — PX: CARPAL TUNNEL RELEASE: SHX101

## 2021-09-03 HISTORY — DX: Carpal tunnel syndrome, unspecified upper limb: G56.00

## 2021-09-03 HISTORY — DX: Other ill-defined heart diseases: I51.89

## 2021-09-03 HISTORY — DX: Obstructive sleep apnea (adult) (pediatric): G47.33

## 2021-09-03 HISTORY — PX: SHOULDER ARTHROSCOPY WITH SUBACROMIAL DECOMPRESSION, ROTATOR CUFF REPAIR AND BICEP TENDON REPAIR: SHX5687

## 2021-09-03 LAB — GLUCOSE, CAPILLARY: Glucose-Capillary: 99 mg/dL (ref 70–99)

## 2021-09-03 SURGERY — SHOULDER ARTHROSCOPY WITH SUBACROMIAL DECOMPRESSION, ROTATOR CUFF REPAIR AND BICEP TENDON REPAIR
Anesthesia: Regional | Site: Shoulder | Laterality: Right

## 2021-09-03 MED ORDER — OXYCODONE HCL 5 MG/5ML PO SOLN: 5.0000 mg | Freq: Once | ORAL | Status: DC | PRN

## 2021-09-03 MED ORDER — BUPIVACAINE-EPINEPHRINE (PF) 0.5% -1:200000 IJ SOLN
INTRAMUSCULAR | Status: AC
Start: 2021-09-03 — End: ?
  Filled 2021-09-03: qty 30

## 2021-09-03 MED ORDER — FENTANYL CITRATE PF 50 MCG/ML IJ SOSY: 50.0000 ug | PREFILLED_SYRINGE | Freq: Once | INTRAMUSCULAR | Status: AC

## 2021-09-03 MED ORDER — ACETAMINOPHEN 10 MG/ML IV SOLN: 1000.0000 mg | Freq: Once | INTRAVENOUS | Status: DC | PRN

## 2021-09-03 MED ORDER — FENTANYL CITRATE PF 50 MCG/ML IJ SOSY
PREFILLED_SYRINGE | INTRAMUSCULAR | Status: AC
  Administered 2021-09-03: 50 ug via INTRAVENOUS
  Filled 2021-09-03: qty 1

## 2021-09-03 MED ORDER — ORAL CARE MOUTH RINSE: 15.0000 mL | Freq: Once | OROMUCOSAL | Status: AC

## 2021-09-03 MED ORDER — BUPIVACAINE HCL (PF) 0.5 % IJ SOLN
INTRAMUSCULAR | Status: DC | PRN
  Administered 2021-09-03: 10 mL

## 2021-09-03 MED ORDER — ROCURONIUM BROMIDE 100 MG/10ML IV SOLN
INTRAVENOUS | Status: DC | PRN
  Administered 2021-09-03: 50 mg via INTRAVENOUS

## 2021-09-03 MED ORDER — CEFAZOLIN SODIUM-DEXTROSE 2-4 GM/100ML-% IV SOLN
INTRAVENOUS | Status: AC
  Filled 2021-09-03: qty 100

## 2021-09-03 MED ORDER — BUPIVACAINE LIPOSOME 1.3 % IJ SUSP
INTRAMUSCULAR | Status: AC
Start: 2021-09-03 — End: 2021-09-03
  Filled 2021-09-03: qty 20

## 2021-09-03 MED ORDER — ONDANSETRON HCL 4 MG/2ML IJ SOLN
INTRAMUSCULAR | Status: DC | PRN
  Administered 2021-09-03: 4 mg via INTRAVENOUS

## 2021-09-03 MED ORDER — DEXAMETHASONE SODIUM PHOSPHATE 10 MG/ML IJ SOLN
INTRAMUSCULAR | Status: DC | PRN
  Administered 2021-09-03: 10 mg via INTRAVENOUS

## 2021-09-03 MED ORDER — OXYCODONE HCL 5 MG PO TABS: 5.0000 mg | ORAL_TABLET | Freq: Once | ORAL | Status: DC | PRN

## 2021-09-03 MED ORDER — PHENYLEPHRINE HCL-NACL 20-0.9 MG/250ML-% IV SOLN
INTRAVENOUS | Status: DC | PRN
  Administered 2021-09-03: 40 ug/min via INTRAVENOUS

## 2021-09-03 MED ORDER — FENTANYL CITRATE (PF) 100 MCG/2ML IJ SOLN: 25.0000 ug | INTRAMUSCULAR | Status: DC | PRN

## 2021-09-03 MED ORDER — MIDAZOLAM HCL 2 MG/2ML IJ SOLN: 1.0000 mg | Freq: Once | INTRAMUSCULAR | Status: AC

## 2021-09-03 MED ORDER — ONDANSETRON HCL 4 MG/2ML IJ SOLN
INTRAMUSCULAR | Status: AC
  Filled 2021-09-03: qty 2

## 2021-09-03 MED ORDER — CHLORHEXIDINE GLUCONATE 0.12 % MT SOLN: 15.0000 mL | Freq: Once | OROMUCOSAL | Status: AC

## 2021-09-03 MED ORDER — CEFAZOLIN SODIUM-DEXTROSE 2-4 GM/100ML-% IV SOLN
2.0000 g | INTRAVENOUS | Status: AC
  Administered 2021-09-03: 2 g via INTRAVENOUS

## 2021-09-03 MED ORDER — ROCURONIUM BROMIDE 10 MG/ML (PF) SYRINGE
PREFILLED_SYRINGE | INTRAVENOUS | Status: AC
  Filled 2021-09-03: qty 10

## 2021-09-03 MED ORDER — ONDANSETRON HCL 4 MG/2ML IJ SOLN: 4.0000 mg | Freq: Once | INTRAMUSCULAR | Status: DC | PRN

## 2021-09-03 MED ORDER — LIDOCAINE HCL (CARDIAC) PF 100 MG/5ML IV SOSY
PREFILLED_SYRINGE | INTRAVENOUS | Status: DC | PRN
Start: 2021-09-03 — End: 2021-09-03
  Administered 2021-09-03: 80 mg via INTRAVENOUS

## 2021-09-03 MED ORDER — LIDOCAINE HCL (PF) 2 % IJ SOLN
INTRAMUSCULAR | Status: AC
  Filled 2021-09-03: qty 5

## 2021-09-03 MED ORDER — BUPIVACAINE-EPINEPHRINE 0.5% -1:200000 IJ SOLN
INTRAMUSCULAR | Status: DC | PRN
  Administered 2021-09-03: 30 mL

## 2021-09-03 MED ORDER — CHLORHEXIDINE GLUCONATE 0.12 % MT SOLN
OROMUCOSAL | Status: AC
  Administered 2021-09-03: 15 mL via OROMUCOSAL
  Filled 2021-09-03: qty 15

## 2021-09-03 MED ORDER — GLYCOPYRROLATE 0.2 MG/ML IJ SOLN
INTRAMUSCULAR | Status: DC | PRN
  Administered 2021-09-03: .2 mg via INTRAVENOUS

## 2021-09-03 MED ORDER — SUGAMMADEX SODIUM 200 MG/2ML IV SOLN
INTRAVENOUS | Status: DC | PRN
  Administered 2021-09-03: 200 mg via INTRAVENOUS

## 2021-09-03 MED ORDER — KETOROLAC TROMETHAMINE 30 MG/ML IJ SOLN
30.0000 mg | Freq: Once | INTRAMUSCULAR | Status: AC
  Administered 2021-09-03: 30 mg via INTRAVENOUS

## 2021-09-03 MED ORDER — MIDAZOLAM HCL 2 MG/2ML IJ SOLN
INTRAMUSCULAR | Status: AC
  Administered 2021-09-03: 1 mg via INTRAVENOUS
  Filled 2021-09-03: qty 2

## 2021-09-03 MED ORDER — FENTANYL CITRATE (PF) 100 MCG/2ML IJ SOLN
INTRAMUSCULAR | Status: DC | PRN
Start: 2021-09-03 — End: 2021-09-03
  Administered 2021-09-03 (×2): 50 ug via INTRAVENOUS

## 2021-09-03 MED ORDER — DEXAMETHASONE SODIUM PHOSPHATE 10 MG/ML IJ SOLN
INTRAMUSCULAR | Status: AC
  Filled 2021-09-03: qty 1

## 2021-09-03 MED ORDER — FENTANYL CITRATE (PF) 100 MCG/2ML IJ SOLN
INTRAMUSCULAR | Status: AC
  Filled 2021-09-03: qty 2

## 2021-09-03 MED ORDER — PHENYLEPHRINE 80 MCG/ML (10ML) SYRINGE FOR IV PUSH (FOR BLOOD PRESSURE SUPPORT)
PREFILLED_SYRINGE | INTRAVENOUS | Status: DC | PRN
  Administered 2021-09-03: 80 ug via INTRAVENOUS

## 2021-09-03 MED ORDER — LACTATED RINGERS IV SOLN: INTRAVENOUS | Status: DC

## 2021-09-03 MED ORDER — PROPOFOL 10 MG/ML IV BOLUS
INTRAVENOUS | Status: AC
  Filled 2021-09-03: qty 20

## 2021-09-03 MED ORDER — KETOROLAC TROMETHAMINE 30 MG/ML IJ SOLN
INTRAMUSCULAR | Status: AC
  Filled 2021-09-03: qty 1

## 2021-09-03 MED ORDER — PROPOFOL 10 MG/ML IV BOLUS
INTRAVENOUS | Status: DC | PRN
  Administered 2021-09-03: 150 mg via INTRAVENOUS

## 2021-09-03 MED ORDER — BUPIVACAINE LIPOSOME 1.3 % IJ SUSP
INTRAMUSCULAR | Status: DC | PRN
  Administered 2021-09-03: 20 mL

## 2021-09-03 MED ORDER — BUPIVACAINE HCL (PF) 0.5 % IJ SOLN
INTRAMUSCULAR | Status: AC
  Filled 2021-09-03: qty 10

## 2021-09-03 MED ORDER — OXYCODONE HCL 5 MG PO TABS: 5.0000 mg | ORAL_TABLET | ORAL | 0 refills | Status: DC | PRN

## 2021-09-03 MED ORDER — BUPIVACAINE HCL (PF) 0.5 % IJ SOLN
INTRAMUSCULAR | Status: AC
  Filled 2021-09-03: qty 30

## 2021-09-03 MED ORDER — EPHEDRINE SULFATE (PRESSORS) 50 MG/ML IJ SOLN
INTRAMUSCULAR | Status: DC | PRN
  Administered 2021-09-03: 10 mg via INTRAVENOUS
  Administered 2021-09-03 (×2): 5 mg via INTRAVENOUS

## 2021-09-03 MED ORDER — FENTANYL BOLUS VIA INFUSION: 50.0000 ug | Freq: Once | INTRAVENOUS | Status: DC

## 2021-09-03 SURGICAL SUPPLY — 82 items
ANCH SUT 2 JK 1.5X2.9 2 LD (Anchor) ×1 IMPLANT
ANCH SUT 5.5 KNTLS (Anchor) ×2 IMPLANT
ANCH SUT Q-FX 2.8 (Anchor) ×2 IMPLANT
ANCHOR ALL-SUT Q-FIX 2.8 (Anchor) ×2 IMPLANT
ANCHOR HEALICOIL REGEN 5.5 (Anchor) ×2 IMPLANT
ANCHOR SUT JK SZ 2 2.9 DBL SL (Anchor) ×1 IMPLANT
APL PRP STRL LF DISP 70% ISPRP (MISCELLANEOUS) ×1
BIT DRILL JUGRKNT W/NDL BIT2.9 (DRILL) IMPLANT
BLADE FULL RADIUS 3.5 (BLADE) ×2 IMPLANT
BLADE SURG 15 STRL LF DISP TIS (BLADE) IMPLANT
BLADE SURG 15 STRL SS (BLADE) ×4
BNDG COHESIVE 4X5 TAN ST LF (GAUZE/BANDAGES/DRESSINGS) ×2 IMPLANT
BNDG ELASTIC 2X5.8 VLCR STR LF (GAUZE/BANDAGES/DRESSINGS) ×2 IMPLANT
BNDG ESMARK 4X12 TAN STRL LF (GAUZE/BANDAGES/DRESSINGS) ×2 IMPLANT
BUR ACROMIONIZER 4.0 (BURR) ×2 IMPLANT
CANNULA SHAVER 8MMX76MM (CANNULA) ×2 IMPLANT
CHLORAPREP W/TINT 26 (MISCELLANEOUS) ×2 IMPLANT
CORD BIP STRL DISP 12FT (MISCELLANEOUS) ×2 IMPLANT
COVER MAYO STAND REUSABLE (DRAPES) ×2 IMPLANT
CUFF TOURN SGL QUICK 18X4 (TOURNIQUET CUFF) ×2 IMPLANT
DILATOR 5.5 THREADED HEALICOIL (MISCELLANEOUS) ×1 IMPLANT
DRAPE EXTREMITY 106X87X128.5 (DRAPES) ×1 IMPLANT
DRAPE IMP U-DRAPE 54X76 (DRAPES) ×2 IMPLANT
DRAPE SURG 17X11 SM STRL (DRAPES) ×2 IMPLANT
DRILL JUGGERKNOT W/NDL BIT 2.9 (DRILL) ×2
ELECT CAUTERY BLADE 6.4 (BLADE) ×2 IMPLANT
ELECT REM PT RETURN 9FT ADLT (ELECTROSURGICAL) ×2
ELECTRODE REM PT RTRN 9FT ADLT (ELECTROSURGICAL) ×1 IMPLANT
FORCEPS JEWEL BIP 4-3/4 STR (INSTRUMENTS) ×2 IMPLANT
GAUZE SPONGE 4X4 12PLY STRL (GAUZE/BANDAGES/DRESSINGS) ×2 IMPLANT
GAUZE XEROFORM 1X8 LF (GAUZE/BANDAGES/DRESSINGS) ×2 IMPLANT
GLOVE BIO SURGEON STRL SZ7.5 (GLOVE) ×4 IMPLANT
GLOVE BIO SURGEON STRL SZ8 (GLOVE) ×4 IMPLANT
GLOVE BIOGEL PI IND STRL 8 (GLOVE) ×1 IMPLANT
GLOVE BIOGEL PI INDICATOR 8 (GLOVE) ×1
GLOVE SURG UNDER LTX SZ8 (GLOVE) ×2 IMPLANT
GOWN STRL REUS W/ TWL LRG LVL3 (GOWN DISPOSABLE) ×1 IMPLANT
GOWN STRL REUS W/ TWL XL LVL3 (GOWN DISPOSABLE) ×1 IMPLANT
GOWN STRL REUS W/TWL LRG LVL3 (GOWN DISPOSABLE) ×2
GOWN STRL REUS W/TWL XL LVL3 (GOWN DISPOSABLE) ×2
GRASPER SUT 15 45D LOW PRO (SUTURE) IMPLANT
IV LACTATED RINGER IRRG 3000ML (IV SOLUTION) ×4
IV LR IRRIG 3000ML ARTHROMATIC (IV SOLUTION) ×2 IMPLANT
KIT CANNULA 8X76-LX IN CANNULA (CANNULA) IMPLANT
KIT CARPAL TUNNEL (MISCELLANEOUS) ×2
KIT ESCP INSRT D SLOT CANN KN (MISCELLANEOUS) ×1 IMPLANT
KIT SUTURE 2.8 Q-FIX DISP (MISCELLANEOUS) ×1 IMPLANT
KIT TURNOVER KIT A (KITS) ×2 IMPLANT
MANIFOLD NEPTUNE II (INSTRUMENTS) ×3 IMPLANT
MASK FACE SPIDER DISP (MASK) ×2 IMPLANT
MAT ABSORB  FLUID 56X50 GRAY (MISCELLANEOUS) ×2
MAT ABSORB FLUID 56X50 GRAY (MISCELLANEOUS) ×1 IMPLANT
NS IRRIG 500ML POUR BTL (IV SOLUTION) ×2 IMPLANT
PACK ARTHROSCOPY SHOULDER (MISCELLANEOUS) ×2 IMPLANT
PACK EXTREMITY ARMC (MISCELLANEOUS) ×2 IMPLANT
PAD ABD DERMACEA PRESS 5X9 (GAUZE/BANDAGES/DRESSINGS) ×4 IMPLANT
PASSER SUT FIRSTPASS SELF (INSTRUMENTS) ×1 IMPLANT
SLING ARM LRG DEEP (SOFTGOODS) ×1 IMPLANT
SLING ULTRA II LG (MISCELLANEOUS) ×2 IMPLANT
SLING ULTRA II M (MISCELLANEOUS) IMPLANT
SPLINT WRIST LG LT TX990309 (SOFTGOODS) IMPLANT
SPLINT WRIST LG RT TX900304 (SOFTGOODS) IMPLANT
SPLINT WRIST M LT TX990308 (SOFTGOODS) IMPLANT
SPLINT WRIST M RT TX990303 (SOFTGOODS) IMPLANT
SPLINT WRIST XL LT TX990310 (SOFTGOODS) IMPLANT
SPLINT WRIST XL RT TX990305 (SOFTGOODS) IMPLANT
SPONGE T-LAP 18X18 ~~LOC~~+RFID (SPONGE) ×2 IMPLANT
STAPLER SKIN PROX 35W (STAPLE) ×2 IMPLANT
STOCKINETTE IMPERVIOUS 9X36 MD (GAUZE/BANDAGES/DRESSINGS) ×2 IMPLANT
STRAP SAFETY 5IN WIDE (MISCELLANEOUS) ×2 IMPLANT
SUT ETHIBOND 0 MO6 C/R (SUTURE) ×2 IMPLANT
SUT PROLENE 4 0 PS 2 18 (SUTURE) ×2 IMPLANT
SUT ULTRABRAID #2 38 (SUTURE) ×2 IMPLANT
SUT ULTRABRAID 2 COBRAID 38 (SUTURE) IMPLANT
SUT VIC AB 2-0 CT1 27 (SUTURE) ×4
SUT VIC AB 2-0 CT1 TAPERPNT 27 (SUTURE) ×2 IMPLANT
TAPE MICROFOAM 4IN (TAPE) ×2 IMPLANT
TOWEL OR 17X26 4PK STRL BLUE (TOWEL DISPOSABLE) ×1 IMPLANT
TUBING CONNECTING 10 (TUBING) ×3 IMPLANT
TUBING INFLOW SET DBFLO PUMP (TUBING) ×2 IMPLANT
WAND WEREWOLF FLOW 90D (MISCELLANEOUS) ×2 IMPLANT
WATER STERILE IRR 500ML POUR (IV SOLUTION) ×2 IMPLANT

## 2021-09-03 NOTE — Anesthesia Procedure Notes (Signed)
Anesthesia Regional Block: Interscalene brachial plexus block  ? ?Pre-Anesthetic Checklist: , timeout performed,  Correct Patient, Correct Site, Correct Laterality,  Correct Procedure,, risks and benefits discussed,  Surgical consent,  Pre-op evaluation,  At surgeon's request and post-op pain management ? ?Laterality: Right ? ?Prep: chloraprep     ?  ?Needles:  ?Injection technique: Single-shot ? ?Needle Type: Echogenic Needle   ? ? ? ? ? ? ? ?Additional Needles: ? ? ?Procedures:,,,, ultrasound used (permanent image in chart),,   ?Motor weakness within 20 minutes.  ?Narrative:  ?Start time: 09/03/2021 9:02 AM ?End time: 09/03/2021 9:07 AM ?Injection made incrementally with aspirations every 5 mL. ? ?Performed by: Personally  ?Anesthesiologist: Darrin Nipper, MD ? ?Additional Notes: ?Functioning IV was confirmed and monitors applied.  Sterile prep and drape, hand hygiene and sterile gloves were used. Ultrasound guidance: relevant anatomy identified, needle position confirmed, local anesthetic spread visualized around nerve(s), vascular puncture avoided.  Image saved to electronic medical record.  Negative aspiration prior to incremental administration of local anesthetic for total 20 ml Exparel and 10 ml bupivacaine 0.5% given in interscalene distribution. The patient tolerated the procedure well. Vital signs and moderate sedation medications recorded in RN notes.  ? ? ? ?

## 2021-09-03 NOTE — Anesthesia Postprocedure Evaluation (Signed)
Anesthesia Post Note ? ?Patient: JAYDIN JALOMO ? ?Procedure(s) Performed: RIGHT SHOULDER ARTHROSCOPY WITH DEBRIDEMENT AND DECOMPRESSION, ROTATOR CUFF REPAIR, AND BICEPS TENODESIS (Right: Shoulder) ?CARPAL TUNNEL RELEASE ENDOSCOPIC (Right: Shoulder) ? ?Patient location during evaluation: PACU ?Anesthesia Type: Regional and General ?Level of consciousness: awake and alert ?Pain management: pain level controlled ?Vital Signs Assessment: post-procedure vital signs reviewed and stable ?Respiratory status: spontaneous breathing, nonlabored ventilation, respiratory function stable and patient connected to nasal cannula oxygen ?Cardiovascular status: blood pressure returned to baseline and stable ?Postop Assessment: no apparent nausea or vomiting ?Anesthetic complications: no ? ? ?No notable events documented. ? ? ?Last Vitals:  ?Vitals:  ? 09/03/21 1400 09/03/21 1437  ?BP: 109/66   ?Pulse: 64 66  ?Resp: (!) 21 16  ?Temp:  (!) 36.4 ?C  ?SpO2: 95% 97%  ?  ?Last Pain:  ?Vitals:  ? 09/03/21 1437  ?TempSrc: Temporal  ?PainSc: 0-No pain  ? ? ?  ?  ?  ?  ?  ?  ? ?Martha Clan ? ? ? ? ?

## 2021-09-03 NOTE — Discharge Instructions (Addendum)
Orthopedic discharge instructions: ?Keep dressings dry and intact.  ?May shower after dressings changed on post-op day #4 (Saturday).  ?Cover staples/sutures with Band-Aids after drying off, then reapply shoulder immobilizer and Velcro wrist splint. ?Apply ice frequently to shoulder and wrist. ?Take ibuprofen 600-800 mg TID with meals for 5-7 days, then as necessary. ?Take oxycodone as prescribed when needed.  ?May supplement with ES Tylenol if necessary. ?Keep shoulder immobilizer on at all times except may remove for bathing purposes. ?Follow-up in 10-14 days or as scheduled. ? ?AMBULATORY SURGERY  ?DISCHARGE INSTRUCTIONS ? ? ?The drugs that you were given will stay in your system until tomorrow so for the next 24 hours you should not: ? ?Drive an automobile ?Make any legal decisions ?Drink any alcoholic beverage ? ? ?You may resume regular meals tomorrow.  Today it is better to start with liquids and gradually work up to solid foods. ? ?You may eat anything you prefer, but it is better to start with liquids, then soup and crackers, and gradually work up to solid foods. ? ? ?Please notify your doctor immediately if you have any unusual bleeding, trouble breathing, redness and pain at the surgery site, drainage, fever, or pain not relieved by medication. ? ? ? ?Additional Instructions: ? ? ? ? ? ? ? ?Please contact your physician with any problems or Same Day Surgery at [phone], Monday through Friday 6 am to 4 pm, or [place] [facility] number at [phone].  ? ?SHOULDER SLING IMMOBILIZER  ? ?VIDEO ?Slingshot 2 Shoulder Brace Application - YouTube ---[email or web address] ? ?INSTRUCTIONS ?While supporting the injured arm, slide the forearm into the sling. Wrap the adjustable shoulder strap around the neck and shoulders and attach the strap end to the sling using  the ?alligator strap tab.?  ?Adjust the shoulder strap to the required length. ?Position the shoulder pad behind the  neck. ?To secure the shoulder pad location (optional), pull the shoulder strap away from the shoulder pad, unfold the hook material on the top of the pad, then press the shoulder strap back onto the hook material to secure the pad in place. ?Attach the closure strap across the open top of the sling. Position the strap so that it holds the arm securely in the sling. Next, attach the thumb strap to the open end of the sling between the thumb and fingers. ?After sling has been fit, it may be easily removed and reapplied using the quick release buckle on shoulder strap. ?If a neutral pillow or 15? abduction pillow is included, place the pillow at the waistline. Attach the sling to the pillow, lining up hook material on the pillow with the loop on sling. Adjust the waist strap to fit.  ?If waist strap is too long, cut it to fit. Use the small piece of double sided hook material (located on top of the pillow) to secure the strap end. Place the double sided hook material on the inside of the cut strap end and secure it to the waist strap.     ?If no pillow is included, attach the waist strap to the sling and adjust to fit.   ? ?Washing Instructions: Straps and sling must be removed and cleaned regularly depending on your activity level and perspiration. Hand wash straps and sling in cold water with mild detergent, rinse, air dry ? ? ?  ? ?   Interscalene Nerve Block with Exparel ? ? For your surgery you have received an Interscalene Nerve Block with Exparel. ?  Nerve Blocks affect many types of nerves, including nerves that control movement, pain and normal sensation.  You may experience feelings such as numbness, tingling, heaviness, weakness or the inability to move your arm or the feeling or sensation that your arm has "fallen asleep". ?A nerve block with Exparel can last up to 5 days.  Usually the weakness wears off first.  The tingling and heaviness usually wear off next.  Finally you may start to notice pain.  Keep in  mind that this may occur in any order.  Once a nerve block starts to wear off it is usually completely gone within 60 minutes. ?ISNB may cause mild shortness of breath, a hoarse voice, blurry vision, unequal pupils, or drooping of the face on the same side as the nerve block.  These symptoms will usually resolve with the numbness.  Very rarely the procedure itself can cause mild seizures. ?If needed, your surgeon will give you a prescription for pain medication.  It will take about 60 minutes for the oral pain medication to become fully effective.  So, it is recommended that you start taking this medication before the nerve block first begins to wear off, or when you first begin to feel discomfort. ?Take your pain medication only as prescribed.  Pain medication can cause sedation and decrease your breathing if you take more than you need for the level of pain that you have. ?Nausea is a common side effect of many pain medications.  You may want to eat something before taking your pain medicine to prevent nausea. ?After an Interscalene nerve block, you cannot feel pain, pressure or extremes in temperature in the effected arm.  Because your arm is numb it is at an increased risk for injury.  To decrease the possibility of injury, please practice the following: ? ?While you are awake change the position of your arm frequently to prevent too much pressure on any one area for prolonged periods of time. ? If you have a cast or tight dressing, check the color or your fingers every couple of hours.  Call your surgeon with the appearance of any discoloration (white or blue). ?If you are given a sling to wear before you go home, please wear it  at all times until the block has completely worn off.  Do not get up at night without your sling. ?Please contact [facility] or your surgeon if you do not begin to regain sensation after 7 days from the surgery.  Anesthesia may be contacted by calling the Same Day Surgery  Department, Mon. through Fri., 6 am to 4 pm at [phone].   ?If you experience any other problems or concerns, please contact your surgeon's office. ?If you experience severe or prolonged shortness of breath go to the nearest emergency department.  ?

## 2021-09-03 NOTE — Op Note (Addendum)
09/03/2021 ? ?1:09 PM ? ?Patient:   Valerie Braun ? ?Pre-Op Diagnosis:   1.  Right carpal tunnel syndrome. ?2.  Impingement/tendinopathy with full-thickness rotator cuff tear and biceps tendinopathy, right shoulder. ? ?Post-Op Diagnosis:   1.  Right carpal tunnel syndrome. ?2.  Impingement/tendinopathy with rotator cuff tear and biceps tendinopathy, right shoulder. ? ?Procedure:   1.  Endoscopic right carpal tunnel release.   ?2.  Limited arthroscopic debridement, arthroscopic subacromial decompression, mini-open rotator cuff repair, and mini-open biceps tenodesis, right shoulder. ? ?Anesthesia:   General endotracheal with interscalene block using Exparel placed preoperatively by the anesthesiologist. ? ?Surgeon:   Pascal Lux, MD ? ?Assistant:   Cameron Proud, PA-C ? ?Findings:   As above. There was a full-thickness tear involving the anterior middle thirds of the supraspinatus insertion with obliquely oriented extension back into the infraspinatus tendon. The remainder of the rotator cuff was in satisfactory condition. There was mild fraying of the labrum anteriorly with a Buford complex. The central portion of the glenoid demonstrated grade 2 chondromalacial changes. Otherwise, the articular surfaces were in excellent condition. The biceps tendon demonstrated moderate fraying with partial-thickness tearing of the intra-articular portion. ? ?Complications:   None ? ?Fluids:   700 cc ? ?Estimated blood loss:   25 cc ? ?Tourniquet time:   None ? ?Drains:   None ? ?Closure:   4-0 Prolene interrupted sutures for the wrist and staples for the shoulder    ? ?Brief clinical note:   The patient is a 56 year old female with a history of progressively worsening right shoulder pain. The patient's symptoms have progressed despite medications, activity modification, etc. The patient's history and examination are consistent with impingement/tendinopathy with a rotator cuff tear. These findings were confirmed by MRI scan.  The patient presents at this time for definitive management of her rotator cuff tear. ? ?The patient also complains of a history of pain and paresthesias to her right hand. The symptoms have persisted despite medications, activity modification, etc. Her history and examination are consistent with carpal tunnel syndrome. She presents at this time for an endoscopic right carpal tunnel release as well. ? ?Procedure:   The patient underwent placement of an interscalene block using Exparel by the anesthesiologist in the preoperative holding area before being brought into the operating room and lain in the supine position. The patient then underwent general endotracheal intubation and anesthesia before being positioned for her carpal tunnel release.  The right hand and upper extremity were prepped with ChloraPrep solution before being draped sterilely.  Preoperative antibiotics were administered.  A timeout was performed to verify the appropriate surgical site before the limb was exsanguinated with an Esmarch and the tourniquet inflated to 250 mmHg. ? ?An approximately 1.5-2 cm incision was made over the volar wrist flexion crease, centered over the palmaris longus tendon. The incision was carried down through the subcutaneous tissues with care taken to identify and protect any neurovascular structures. The distal forearm fascia was penetrated just proximal to the transverse carpal ligament. The soft tissues were released off the superficial and deep surfaces of the distal forearm fascia and this was released proximally for 3-4 cm under direct visualization. ? ?Attention was directed distally. The Soil scientist was passed beneath the transverse carpal ligament along the ulnar aspect of the carpal tunnel and used to release any adhesions as well as to remove any adherent synovial tissue before first the smaller then the larger of the two dilators were passed beneath the transverse  carpal ligament along the ulnar margin of  the carpal tunnel. The slotted cannula was introduced and the endoscope was placed into the slotted cannula and the undersurface of the transverse carpal ligament visualized. The distal margin of the transverse carpal ligament was marked by placing a 25-gauge needle percutaneously at Brea cardinal point so that it entered the distal portion of the slotted cannula. Under endoscopic visualization, the transverse carpal ligament was released from proximal to distal using the end-cutting blade. A second pass was performed to ensure complete release of the ligament. The adequacy of release was verified both endoscopically and by palpation using the freer elevator. ? ?The wound was irrigated thoroughly with sterile saline solution before being closed using 4-0 Prolene interrupted sutures. A total of 10 cc of 0.5% plain Sensorcaine was injected in and around the incision before a sterile bulky dressing was applied to the wound. ? ?Next, the patient's rotator cuff tear was addressed.  The patient was repositioned in the beach chair position using the beach chair positioner. The right shoulder and upper extremity were prepped with ChloraPrep solution before being draped sterilely. Preoperative antibiotics were administered. A second timeout was performed to confirm the proper surgical site before the expected portal sites and incision site were injected with 0.5% Sensorcaine with epinephrine.  ? ?A posterior portal was created and the glenohumeral joint thoroughly inspected with the findings as described above. An anterior portal was created using an outside-in technique. The labrum and rotator cuff were further probed, again confirming the above-noted findings. Areas of synovitis were debrided back to stable margins using the full-radius resector, as were the frayed margins of the rotator cuff tear. The ArthroCare wand was inserted and used to release the biceps tendon from its labral anchor.  It also was used to obtain  hemostasis as well as to "anneal" the labrum superiorly and anteriorly. The instruments were removed from the joint after suctioning the excess fluid. ? ?The camera was repositioned through the posterior portal into the subacromial space. A separate lateral portal was created using an outside-in technique. The 3.5 mm full-radius resector was introduced and used to perform a subtotal bursectomy. The ArthroCare wand was then inserted and used to remove the periosteal tissue off the undersurface of the anterior third of the acromion as well as to recess the coracoacromial ligament from its attachment along the anterior and lateral margins of the acromion. The 4.0 mm acromionizing bur was introduced and used to complete the decompression by removing the undersurface of the anterior third of the acromion. The full radius resector was reintroduced to remove any residual bony debris before the ArthroCare wand was reintroduced to obtain hemostasis. The instruments were then removed from the subacromial space after suctioning the excess fluid. ? ?An approximately 4-5 cm incision was made over the anterolateral aspect of the shoulder beginning at the anterolateral corner of the acromion and extending distally in line with the bicipital groove. This incision was carried down through the subcutaneous tissues to expose the deltoid fascia. The raphae between the anterior and middle thirds was identified and this plane developed to provide access into the subacromial space. Additional bursal tissues were debrided sharply using Metzenbaum scissors. The rotator cuff tear was readily identified. The margins were debrided sharply with a #15 blade and the exposed greater tuberosity roughened with a rongeur. The longitudinal portion of the tear was repaired using two #2 FiberWire interrupted sutures placed in a side-to-side fashion utilizing the [employer] first pass device.   ? ?  The more lateral portion of the tear was repaired  using two [employer] 2.8 mm Q-Fix anchors. These sutures were then brought back laterally and secured using two Franklin knotless RegeneSorb anchors to create a two-layer closure. An

## 2021-09-03 NOTE — Transfer of Care (Signed)
Immediate Anesthesia Transfer of Care Note ? ?Patient: Valerie Braun ? ?Procedure(s) Performed: RIGHT SHOULDER ARTHROSCOPY WITH DEBRIDEMENT AND DECOMPRESSION, ROTATOR CUFF REPAIR, AND BICEPS TENODESIS (Right: Shoulder) ?CARPAL TUNNEL RELEASE ENDOSCOPIC (Right: Shoulder) ? ?Patient Location: PACU ? ?Anesthesia Type:General ? ?Level of Consciousness: awake ? ?Airway & Oxygen Therapy: Patient Spontanous Breathing and Patient connected to face mask oxygen ? ?Post-op Assessment: Report given to RN and Post -op Vital signs reviewed and stable ? ?Post vital signs: Reviewed and stable ? ?Last Vitals:  ?Vitals Value Taken Time  ?BP 127/86 09/03/21 1312  ?Temp    ?Pulse 89 09/03/21 1316  ?Resp 19 09/03/21 1316  ?SpO2 98 % 09/03/21 1316  ?Vitals shown include unvalidated device data. ? ?Last Pain:  ?Vitals:  ? 09/03/21 0926  ?TempSrc:   ?PainSc: 0-No pain  ?   ? ?  ? ?Complications: No notable events documented. ?

## 2021-09-03 NOTE — H&P (Signed)
History of Present Illness:  ?Valerie Braun is a 55 y.o. female who presents today for repeat evaluation of ongoing right shoulder pain and discussion of recent right shoulder MRI results. The patient has been evaluated past and diagnosed with presumed impingement/tendinopathy and has undergone several right shoulder steroid injections. The patient last underwent a right shoulder injection 2019 which she did state provided significant relief however over the past several months she had increased pain especially trying to reach out to the side and above her head. 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 Given the length of symptoms it was decided to proceed with a MRI scan for further evaluation. The patient's pain is primarily on the anterior lateral aspect of the right shoulder and does occasionally radiate into the right elbow. She does have increased pain at night when attempting to sleep on the right side. She does report some tingling and burning in her right hand, she has been seen in the past and diagnosed with right carpal tunnel syndrome and has undergone several injections in the past as well. She denies any repeat trauma or injury affecting the right shoulder. She denies any personal history of asthma, COPD or DVT. The patient does have a history of a NSTEMI and is on aspirin. No history of stroke. ? ?Current Outpatient Medications: ? amLODIPine (NORVASC) 5 MG tablet Take 1 tablet (5 mg total) by mouth once daily 90 tablet 3  ? aspirin 81 MG EC tablet Take 1 tablet by mouth once daily  ? blood pressure test kit-large Kit  ? co-enzyme Q-10, ubiquinone, 100 mg capsule Take 100 mg by mouth once daily  ? esomeprazole (NEXIUM) 40 MG DR capsule Take 1 capsule (40 mg total) by mouth 2 (two) times daily 180 capsule 3  ? fluticasone propionate (FLONASE) 50 mcg/actuation nasal spray Place 2 sprays into both nostrils as needed  ? ginkgo biloba 120 mg Tab Take 1 tablet by  mouth once daily  ? ibuprofen-diphenhydramine cit (ADVIL PM) 200-38 mg Tab Take 2 tablets by mouth as needed  ? icosapent ethyL (VASCEPA) 1 gram capsule Take 2 capsules (2 g total) by mouth 2 (two) times daily 360 capsule 3  ? linaCLOtide (LINZESS) 145 mcg capsule Take 1 capsule (145 mcg total) by mouth once daily 90 capsule 3  ? losartan (COZAAR) 50 MG tablet Take 1 tablet (50 mg total) by mouth once daily 90 tablet 3  ? metoprolol succinate (TOPROL-XL) 50 MG XL tablet Take 1 tablet (50 mg total) by mouth once daily 90 tablet 3  ? multivitamin tablet Take 1 tablet by mouth once daily.  ? olopatadine (PATANASE) 0.6 % nasal spray Place 1 spray into both nostrils as needed  ? rosuvastatin (CRESTOR) 5 MG tablet Take one tablet each night. 30 tablet 11  ? spironolactone (ALDACTONE) 25 MG tablet TAKE 1 TABLET BY MOUTH EVERY DAY 90 tablet 1  ? ?Allergies:  ? Atorvastatin Muscle Pain (Muscle aches) ? Other Red Kool Aid (Itching)  ? ?Past Medical History:  ? Abdominal hernia  ? CAD (coronary artery disease) 07/16/2020  ? Chicken pox  ? Chronic constipation  ? Essential hypertension 01/16/2021  ? GERD (gastroesophageal reflux disease)  ? Hyperlipidemia 07/16/2020  ? IBS (irritable bowel syndrome)  ? Measles  ? NSTEMI (CMS-HCC) 12/2019  ? Osteopenia  ? Right carpal tunnel syndrome 02/29/2016  ? Vitamin D deficiency 08/25/2013  ? ?Past Surgical History:  ? EGD 01/15/1996  ?  SIGMOIDOSCOPY FLEXIBLE 02/23/1996  ? EGD 05/25/1997  ? COLONOSCOPY 09/22/2003  ? COLONOSCOPY 08/31/2012 (Normal. Repeat 10 Years)  ? EGD 03/01/2018 (Negative EGD biopsy/No Repeat/TKT)  ? COLONOSCOPY  ? [facility] (Dr. Lucky Cowboy)  ? SIGMOIDOSCOPY  ? TUBAL LIGATION  ? ?Family History:  ? Arthritis Mother  ? Gout Mother  ? Diabetes Mother  ? High blood pressure (Hypertension) Mother  ? Hyperlipidemia (Elevated cholesterol) Mother  ? Diabetes type II Mother (pills)  ? Hip fracture Father  ? Osteoporosis (Thinning of bones) Father  ? Alzheimer's disease Maternal Grandmother   ? Tuberculosis Maternal Grandfather  ? COPD Maternal Aunt  ? Diabetes Maternal Aunt  ? Asthma Maternal Aunt  ? Diabetes type II Maternal Aunt (pills)  ? Lung cancer Maternal Uncle (2 maternal uncles)  ? Alcohol abuse Maternal Uncle  ? Deep vein thrombosis Maternal Uncle  ? Diabetes type II Maternal Uncle (deceased)  ? Myocardial Infarction (Heart attack) Maternal Uncle  ? Liver disease Maternal Uncle  ? Alcohol abuse Maternal Uncle  ? Cancer Maternal Uncle  ? Coronary Artery Disease Maternal Uncle  ? Prostate cancer Maternal Uncle  ? Colon cancer Maternal Uncle  ? Seizures Maternal Uncle  ? ?Social History:  ? ?Socioeconomic History:  ? Marital status: Single  ? Number of children: 1  ?Occupational History  ? Occupation: Mail Carrier  ?Tobacco Use  ? Smoking status: Never  ? Smokeless tobacco: Never  ?Vaping Use  ? Vaping Use: Never used  ?Substance and Sexual Activity  ? Alcohol use: Yes  ?Alcohol/week: 3.0 standard drinks  ?Types: 1 Glasses of wine, 1 Cans of beer, 1 Standard drinks or equivalent per week  ?Comment: Occasionally drink  ? Drug use: Never  ? Sexual activity: Yes  ?Partners: Male  ?Birth control/protection: Surgical  ?Comment: tubes tied  ?Social History Narrative  ?Pt is engaged, has 1 adult daughter who lives with her.  ? ?Review of Systems:  ?A comprehensive 14 point ROS was performed, reviewed, and the pertinent orthopaedic findings are documented in the HPI. ? ?Physical Exam: ?BP 132/82 (BP Location: Left upper arm, Patient Position: Sitting, BP Cuff Size: Adult)  Ht 165.1 cm ('5\' 5"' )  Wt 83.2 kg (183 lb 6.4 oz)  LMP (LMP Unknown)  BMI 30.52 kg/m?  ?General/Constitutional: The patient appears to be well-nourished, well-developed, and in no acute distress. ?Neuro/Psych: Normal mood and affect, oriented to person, place and time. ?Eyes: Non-icteric. Pupils are equal, round, and reactive to light, and exhibit synchronous movement. ?ENT: Unremarkable. ?Lymphatic: No palpable  adenopathy. ?Respiratory: Lungs clear to auscultation, Normal chest excursion, No wheezes and Non-labored breathing ?Cardiovascular: Regular rate and rhythm. No murmurs. and No edema, swelling or tenderness, except as noted in detailed exam. ?Integumentary: No impressive skin lesions present, except as noted in detailed exam. ?Musculoskeletal: Unremarkable, except as noted in detailed exam. ? ?Right shoulder exam: ?SKIN: Normal ?SWELLING: None ?WARMTH: None ?LYMPH NODES: No adenopathy palpable ?CREPITUS: None ?TENDERNESS: Minimal discomfort over anterolateral acromion ?ROM (active):   ?   Forward flexion: 155? ?   Abduction: 150? ?   Internal rotation: T12 ?ROM (passive):   ?   Forward flexion: 160? ?   Abduction: 155?    ?   ER/IR at 90 abd: 90?/70? ?  ?She describes moderate pain at the extremes of internal rotation and internal rotation at 90? of abduction. ?  ?STRENGTH:   Forward flexion: 4+/5 ?  Abduction: 4+/5 ?                        External rotation: 4+/5 ?                        Internal rotation: 4+/5 ?                        Pain with RC testing: None ?  ?STABILITY: Normal ?  ?SPECIAL TESTS:       Luan Pulling' test: positive ?                                    Speed's test: Negative ?                                    Capsulitis - pain w/ passive ER: No ?                                    Crossed arm test: Minimally positive ?                                    Crank: Not evaluated ?                                    Anterior apprehension: Negative ?Drop Arm Test: Positive ? ?Right wrist exam: ?The patient has a positive Tinel's test of the right carpal tunnel, positive Phalen's test. The patient does report decrease sensation light touch in the median nerve distribution of the right hand. ?  ?Imaging: ?True AP, Y-scapular, and axillary views of the right shoulder were obtained at a previous visit. These films demonstrate mild degenerative changes, these x-rays do demonstrate a  small osteophyte off of the inferior aspect of the glenoid. The subacromial space is mildly decreased. There is no subacromial or infra-clavicular spurring. She demonstrates a Type I acromion. ? ?MRI OF THE RIGHT SHOULDER WITHOUT

## 2021-09-03 NOTE — Anesthesia Preprocedure Evaluation (Addendum)
Anesthesia Evaluation  ?Patient identified by MRN, date of birth, ID band ?Patient awake ? ? ? ?Reviewed: ?Allergy & Precautions, NPO status , Patient's Chart, lab work & pertinent test results ? ?History of Anesthesia Complications ?Negative for: history of anesthetic complications ? ?Airway ?Mallampati: III ? ? ?Neck ROM: Full ? ? ? Dental ?no notable dental hx. ? ?  ?Pulmonary ?sleep apnea and Continuous Positive Airway Pressure Ventilation ,  ?  ?Pulmonary exam normal ?breath sounds clear to auscultation ? ? ? ? ? ? Cardiovascular ?hypertension, + CAD (s/p MI)  ?Normal cardiovascular exam ?Rhythm:Regular Rate:Normal ? ?Echo 08/16/21:  ?NORMAL STRESS TEST. NORMAL RESTING STUDY WITH NO WALL MOTION ABNORMALITIES  ??AT REST AND PEAK STRESS.  ??NORMAL LA PRESSURES WITH NORMAL DIASTOLIC FUNCTION  ??VALVULAR REGURGITATION: TRIVIAL MR, MODERATE TR  ??NO VALVULAR STENOSIS  ??Maximum workload of 10.30 METs was achieved during exercise.  ??NORMAL RESTING BP - APPROPRIATE RESPONSE ? ?ECG 08/14/21:  ?Normal sinus rhythm  ?Early precordial QRS transition  ?Left ventricular hypertrophy ? ?  ?Neuro/Psych ?negative neurological ROS ?   ? GI/Hepatic ?GERD  ,  ?Endo/Other  ?Prediabetes  ? Renal/GU ?negative Renal ROS  ? ?  ?Musculoskeletal ? ?(+) Arthritis ,  ? Abdominal ?  ?Peds ? Hematology ?negative hematology ROS ?(+)   ?Anesthesia Other Findings ?Reviewed and agree with Bayard Males pre-anesthesia clinical review note. ? ?Cardiology note 08/14/21:  ?Coronary artery disease ?Elevated Lp(a) ?Mixed Hyperlipidemia ?s/p NSTEMI 01/10/2020 with occluded distal LAD and D2. 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 Treated medically thus far. Denies anginal symptoms but does have some DOE so will pursue stress testing. 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 Had a CPX last year with good exercise (13.8 METs) but did have some  usloping ST depression on EKG.  ?- Continues on ASA '81mg'$  daily ?- Continues on metoprolol succinate '50mg'$  daily and losartan '50mg'$  daily ?- Continues on aggressive lipid management given elevated Lp(a) and young onset of disease. Needs LDL <50. Cant go too low for her, discussed this today. LDL 61 Tg 51 this past month.  ?- rosuvastatin '5mg'$  daily ?- vascepa 2gm BID ?- ADD ezetimibe '10mg'$  daily and recheck lipids at June follow up.  ? ?Essential hypertension ?Grade I Diastolic Dysfunction ?Mild-Moderate TR ?Mild MR ?Grade I DD, mild-mod TR, and mild MR seen on 2021 echo at [facility]. Normal EF. Today looks euvolemic. Reports ongoing DOE. BNP not elevated in the past. Checked again today, not elevated. BP well controlled on amlodipine '5mg'$  daily, losartan '50mg'$  daily, spiro '25mg'$  daily. and metoprolol succinate '50mg'$  daily. Long term needs to work on weight loss. Has pre-DM so SGLT2 would be good option for her but for now with normal BNP and appearing euvolemic will hold off.  ? ?Preoperative Exam  ?With regards to her upcoming shoulder surgery it is very reasonable to pursue repair given the limitation her pain is having on her daily life. She had excellent exercise capacity on a recent CPX in 2022 (13 METs), however has some ongoing dyspnea with exertion. She does have known CAD that is being medically managed so I would like to complete a stress test to make sure there are no active areas of concern. If her stress test is reassuring it would be reasonable to continue with surgery.  ? ?Orders Placed This Encounter  ?Procedures  ?? Pro-Brain Natriuretic peptide, N-Terminal (NT-pro-BNP)  ??  ECG 12-lead  ?? Echo stress test  ? ?DISPOSITION  ? ?Return in about 6 weeks (around 09/23/2021) for as scheduled in June with Dr Edwin Dada. ? ? Reproductive/Obstetrics ? ?  ? ? ? ? ? ? ? ? ? ? ? ? ? ?  ?  ? ? ? ? ? ? ? ?Anesthesia Physical ?Anesthesia Plan ? ?ASA: 3 ? ?Anesthesia Plan: General and Regional  ? ?Post-op Pain Management:  Regional block*  ? ?Induction: Intravenous ? ?PONV Risk Score and Plan: 3 and Ondansetron, Dexamethasone and Treatment may vary due to age or medical condition ? ?Airway Management Planned: Oral ETT ? ?Additional Equipment:  ? ?Intra-op Plan:  ? ?Post-operative Plan: Extubation in OR ? ?Informed Consent: I have reviewed the patients History and Physical, chart, labs and discussed the procedure including the risks, benefits and alternatives for the proposed anesthesia with the patient or authorized representative who has indicated his/her understanding and acceptance.  ? ? ? ?Dental advisory given ? ?Plan Discussed with: CRNA ? ?Anesthesia Plan Comments: (Plan for preoperative interscalene nerve block and GETA.  Patient consented for risks of anesthesia including but not limited to:  ?- adverse reactions to medications ?- damage to eyes, teeth, lips or other oral mucosa ?- nerve damage due to positioning  ?- sore throat or hoarseness ?- damage to heart, brain, nerves, lungs, other parts of body or loss of life ? ?Informed patient about role of CRNA in peri- and intra-operative care.  Patient voiced understanding.)  ? ? ? ? ? ? ?Anesthesia Quick Evaluation ? ?

## 2021-09-03 NOTE — Anesthesia Procedure Notes (Signed)
Procedure Name: Intubation ?Date/Time: 09/03/2021 10:46 AM ?Performed by: Fredderick Phenix, CRNA ?Pre-anesthesia Checklist: Patient identified, Emergency Drugs available, Suction available and Patient being monitored ?Patient Re-evaluated:Patient Re-evaluated prior to induction ?Oxygen Delivery Method: Circle system utilized ?Preoxygenation: Pre-oxygenation with 100% oxygen ?Induction Type: IV induction ?Ventilation: Mask ventilation without difficulty ?Laryngoscope Size: McGraph and 4 ?Grade View: Grade II ?Tube type: Oral ?Number of attempts: 1 ?Airway Equipment and Method: Stylet, Oral airway and Bougie stylet ?Placement Confirmation: ETT inserted through vocal cords under direct vision, positive ETCO2 and breath sounds checked- equal and bilateral ?Secured at: 21 cm ?Tube secured with: Tape ?Dental Injury: Teeth and Oropharynx as per pre-operative assessment  ? ? ? ? ?

## 2021-09-04 ENCOUNTER — Encounter: Payer: Self-pay | Admitting: Surgery

## 2021-09-09 NOTE — Progress Notes (Signed)
This would be a chronic rotator cuff tear as she has been symptomatic for several years.  Thank you!

## 2022-01-12 ENCOUNTER — Other Ambulatory Visit: Payer: Self-pay

## 2022-01-12 ENCOUNTER — Emergency Department
Admission: EM | Admit: 2022-01-12 | Discharge: 2022-01-12 | Disposition: A | Discharge status: Home / Self Care | Payer: Federal, State, Local not specified - PPO | Attending: Emergency Medicine | Admitting: Emergency Medicine

## 2022-01-12 ENCOUNTER — Emergency Department: Payer: Federal, State, Local not specified - PPO

## 2022-01-12 ENCOUNTER — Encounter: Payer: Self-pay | Admitting: Intensive Care

## 2022-01-12 DIAGNOSIS — R0789 Other chest pain: Secondary | ICD-10-CM | POA: Diagnosis not present

## 2022-01-12 DIAGNOSIS — R0602 Shortness of breath: Secondary | ICD-10-CM | POA: Diagnosis not present

## 2022-01-12 DIAGNOSIS — R61 Generalized hyperhidrosis: Secondary | ICD-10-CM | POA: Diagnosis not present

## 2022-01-12 DIAGNOSIS — R079 Chest pain, unspecified: Secondary | ICD-10-CM | POA: Diagnosis present

## 2022-01-12 LAB — CBC
HCT: 41.6 % (ref 36.0–46.0)
Hemoglobin: 13.6 g/dL (ref 12.0–15.0)
MCH: 30.4 pg (ref 26.0–34.0)
MCHC: 32.7 g/dL (ref 30.0–36.0)
MCV: 92.9 fL (ref 80.0–100.0)
Platelets: 262 10*3/uL (ref 150–400)
RBC: 4.48 MIL/uL (ref 3.87–5.11)
RDW: 13.1 % (ref 11.5–15.5)
WBC: 9.3 10*3/uL (ref 4.0–10.5)
nRBC: 0 % (ref 0.0–0.2)

## 2022-01-12 LAB — TROPONIN I (HIGH SENSITIVITY)
Troponin I (High Sensitivity): 3 ng/L (ref <18)
Troponin I (High Sensitivity): 2 ng/L (ref <18)

## 2022-01-12 LAB — BASIC METABOLIC PANEL
Anion gap: 6 (ref 5–15)
BUN: 14 mg/dL (ref 6–20)
CO2: 27 mmol/L (ref 22–32)
Calcium: 9 mg/dL (ref 8.9–10.3)
Chloride: 109 mmol/L (ref 98–111)
Creatinine, Ser: 0.77 mg/dL (ref 0.44–1.00)
GFR, Estimated: >60 mL/min (ref >60)
Glucose, Bld: 123 mg/dL — ABNORMAL HIGH (ref 70–99)
Potassium: 3.6 mmol/L (ref 3.5–5.1)
Sodium: 142 mmol/L (ref 135–145)

## 2022-01-12 MED ORDER — ASPIRIN 325 MG PO TABS
325.0000 mg | ORAL_TABLET | Freq: Every day | ORAL | Status: DC
  Administered 2022-01-12: 325 mg via ORAL
  Filled 2022-01-12: qty 1

## 2022-01-12 MED ORDER — NITROGLYCERIN 0.4 MG SL SUBL
0.4000 mg | SUBLINGUAL_TABLET | SUBLINGUAL | 1 refills | Status: AC | PRN
Start: 2022-01-12 — End: ?

## 2022-01-12 NOTE — ED Provider Notes (Signed)
[facility] Provider Note    Event Date/Time   First MD Initiated Contact with Patient 01/12/22 1514     (approximate)   History   Chest Pain   HPI  Valerie Braun is a 55 y.o. female  who presents to the emergency department today because of concern for chest pain. Located in the center part of her chest. Initially sharp and then more throbbing. It did not radiate. She has chronic shortness of breath and diaphoresis but it does not appear those were worse for her today. No unusual exertion today. Did have a heart attack 2 years ago so tried taking a nitroglycerin. It was expired. By the time of my exam however the patient states that she is chest pain free. Denies any recent illness.      Physical Exam   Triage Vital Signs: ED Triage Vitals  Enc Vitals Group     BP 01/12/22 1347 138/78     Pulse Rate 01/12/22 1347 77     Resp 01/12/22 1347 16     Temp 01/12/22 1347 98.4 F (36.9 C)     Temp Source 01/12/22 1347 Oral     SpO2 01/12/22 1347 97 %     Weight 01/12/22 1345 189 lb (85.7 kg)     Height 01/12/22 1345 '5\' 4"'$  (1.626 m)     Head Circumference --      Peak Flow --      Pain Score 01/12/22 1345 10     Pain Loc --      Pain Edu? --      Excl. in [place]? --     Most recent vital signs: Vitals:   01/12/22 1347 01/12/22 1450  BP: 138/78 108/62  Pulse: 77 62  Resp: 16 16  Temp: 98.4 F (36.9 C)   SpO2: 97% 99%    General: Awake, alert, oriented. CV:  Good peripheral perfusion. Regular rate and rhythm. Resp:  Normal effort. Lungs clear. Abd:  No distention.    ED Results / Procedures / Treatments   Labs (all labs ordered are listed, but only abnormal results are displayed) Labs Reviewed  BASIC METABOLIC PANEL - Abnormal; Notable for the following components:      Result Value   Glucose, Bld 123 (*)    All other components within normal limits  CBC  TROPONIN I (HIGH SENSITIVITY)  TROPONIN I (HIGH SENSITIVITY)     EKG  I,  Nance Pear, attending physician, personally viewed and interpreted this EKG  EKG Time: 1346 Rate: 76 Rhythm: normal sinus rhythm Axis: normal Intervals: qtc 396 QRS: LVH ST changes: no st elevation Impression: abnormal ekg   RADIOLOGY I independently interpreted and visualized the CXR. My interpretation: No pneumonia Radiology interpretation:  IMPRESSION:  No active cardiopulmonary disease.         PROCEDURES:  Critical Care performed: No  Procedures   MEDICATIONS ORDERED IN ED: Medications - No data to display   IMPRESSION / MDM / [place] / ED COURSE  I reviewed the triage vital signs and the nursing notes.                              Differential diagnosis includes, but is not limited to, ACS, esophagitis, pneumonia.  Patient's presentation is most consistent with acute presentation with potential threat to life or bodily function.  Patient presents to the emergency department today because of concern  for chest pain. The patient was pain free at the time of my exam. Two troponins were negative. Chest x-ray without concerning abnormality. At this time have low suspicion for PE or dissection. Possible angina. Given reassuring work up in the emergency department and that patient remained pain free on reassessment, I do think it is reasonable for patient to be discharged to follow up with cardiology. Will give prescription for nitroglycerin. Discussed importance of close follow up with cardiology. Discussed return precautions.   FINAL CLINICAL IMPRESSION(S) / ED DIAGNOSES   Final diagnoses:  Nonspecific chest pain      Note:  This document was prepared using Dragon voice recognition software and may include unintentional dictation errors.    Nance Pear, MD 01/12/22 [phone]

## 2022-01-12 NOTE — Discharge Instructions (Signed)
Please seek medical attention for any high fevers, chest pain, shortness of breath, change in behavior, persistent vomiting, bloody stool or any other new or concerning symptoms.  

## 2022-01-12 NOTE — ED Triage Notes (Signed)
Patient c/o chest tightness. No radiation. Reports taking 1 nitro that was out of date at home with relief and then CP came back. A&O x4 in triage

## 2022-02-14 ENCOUNTER — Other Ambulatory Visit: Payer: Self-pay | Admitting: Physician Assistant

## 2022-02-14 DIAGNOSIS — Z1231 Encounter for screening mammogram for malignant neoplasm of breast: Secondary | ICD-10-CM

## 2022-03-12 ENCOUNTER — Other Ambulatory Visit: Payer: Self-pay | Admitting: Podiatry

## 2022-03-18 ENCOUNTER — Ambulatory Visit: Payer: Self-pay

## 2022-03-19 ENCOUNTER — Encounter
Admission: RE | Admit: 2022-03-19 | Discharge: 2022-03-19 | Disposition: A | Discharge status: Home / Self Care | Payer: Federal, State, Local not specified - PPO | Source: Ambulatory Visit | Attending: Podiatry | Admitting: Podiatry

## 2022-03-19 VITALS — Ht 64.0 in | Wt 200.8 lb

## 2022-03-19 DIAGNOSIS — I10 Essential (primary) hypertension: Secondary | ICD-10-CM

## 2022-03-19 DIAGNOSIS — Z01812 Encounter for preprocedural laboratory examination: Secondary | ICD-10-CM

## 2022-03-19 DIAGNOSIS — Z79899 Other long term (current) drug therapy: Secondary | ICD-10-CM

## 2022-03-19 HISTORY — DX: Lymphedema, not elsewhere classified: I89.0

## 2022-03-19 NOTE — Patient Instructions (Signed)
Your procedure is scheduled on:03-28-22 Friday Report to the Registration Desk on the 1st floor of the [facility].Then proceed to the 2nd floor Surgery Desk To find out your arrival time, please call [phone] between 1PM - 3PM on:03-27-22 Thursday If your arrival time is 6:00 am, do not arrive prior to that time as the [facility] entrance doors do not open until 6:00 am.  REMEMBER: Instructions that are not followed completely may result in serious medical risk, up to and including death; or upon the discretion of your surgeon and anesthesiologist your surgery may need to be rescheduled.  Do not eat food after midnight the night before surgery.  No gum chewing, lozengers or hard candies.  You may however, drink CLEAR liquids up to 2 hours before you are scheduled to arrive for your surgery. Do not drink anything within 2 hours of your scheduled arrival time.  Clear liquids include: - water  - apple juice without pulp - gatorade (not RED colors) - black coffee or tea (Do NOT add milk or creamers to the coffee or tea) Do NOT drink anything that is not on this list.  In addition, your doctor has ordered for you to drink the provided  Gatorade G2 Drinking this carbohydrate drink up to two hours before surgery helps to reduce insulin resistance and improve patient outcomes. Please complete drinking 2 hours prior to scheduled arrival time.  TAKE THESE MEDICATIONS THE MORNING OF SURGERY WITH A SIP OF WATER: -amLODipine (NORVASC)  -esomeprazole (NEXIUM)  -ezetimibe (ZETIA)  -metoprolol succinate (TOPROL-XL)   Call Dr. Karna Christmas office today (03-19-22) to see when you need to stop your 81 mg Aspirin  One week prior to surgery: Stop Anti-inflammatories (NSAIDS) such as Advil, Aleve, Ibuprofen, Motrin, Naproxen, Naprosyn and Aspirin based products such as Excedrin, Goodys Powder, BC Powder.You may however, continue to take Tylenol if needed for pain up until the day of surgery.  Stop  ANY OVER THE COUNTER supplements/vitamins 7 days prior to surgery (Calcium +D, Co Q 10, Vitamin B12, Ginkgo Biloba, Multivitamin, Turmeric)  No Alcohol for 24 hours before or after surgery.  No Smoking including e-cigarettes for 24 hours prior to surgery.  No chewable tobacco products for at least 6 hours prior to surgery.  No nicotine patches on the day of surgery.  Do not use any "recreational" drugs for at least a week prior to your surgery.  Please be advised that the combination of cocaine and anesthesia may have negative outcomes, up to and including death. If you test positive for cocaine, your surgery will be cancelled.  On the morning of surgery brush your teeth with toothpaste and water, you may rinse your mouth with mouthwash if you wish. Do not swallow any toothpaste or mouthwash.  Use CHG Soap as directed on instruction sheet.  Do not wear jewelry, make-up, hairpins, clips or nail polish.  Do not wear lotions, powders, or perfumes.   Do not shave body from the neck down 48 hours prior to surgery just in case you cut yourself which could leave a site for infection.  Also, freshly shaved skin may become irritated if using the CHG soap.  Contact lenses, hearing aids and dentures may not be worn into surgery.  Do not bring valuables to the hospital. [facility] is not responsible for any missing/lost belongings or valuables.   Bring your C-PAP to the hospital with you  Notify your doctor if there is any change in your medical condition (cold, fever,  infection).  Wear comfortable clothing (specific to your surgery type) to the hospital.  After surgery, you can help prevent lung complications by doing breathing exercises.  Take deep breaths and cough every 1-2 hours. Your doctor may order a device called an Incentive Spirometer to help you take deep breaths. When coughing or sneezing, hold a pillow firmly against your incision with both hands. This is called "splinting."  Doing this helps protect your incision. It also decreases belly discomfort.  If you are being admitted to the hospital overnight, leave your suitcase in the car. After surgery it may be brought to your room.  If you are being discharged the day of surgery, you will not be allowed to drive home. You will need a responsible adult (18 years or older) to drive you home and stay with you that night.   If you are taking public transportation, you will need to have a responsible adult (18 years or older) with you. Please confirm with your physician that it is acceptable to use public transportation.   Please call the [facility] at [phone] if you have any questions about these instructions.  Surgery Visitation Policy:  Patients undergoing a surgery or procedure may have two family members or support persons with them as long as the person is not COVID-19 positive or experiencing its symptoms.   MASKING: Due to an increase in RSV rates and hospitalizations, starting Wednesday, Nov. 15, in patient care areas in which we serve newborns, infants and children, masks will be required for teammates and visitors.  Children ages 82 and under may not visit. This policy affects the following departments only:  [facility] [place] [facility] area Mother Baby Unit Newborn nursery/Special care nursery  Other areas: Masks continue to be strongly recommended for [facility] teammates, visitors and patients in all other areas. Visitation is not restricted outside of the units listed above.   How to Use an Incentive Spirometer An incentive spirometer is a tool that measures how well you are filling your lungs with each breath. Learning to take long, deep breaths using this tool can help you keep your lungs clear and active. This may help to reverse or lessen your chance of developing breathing (pulmonary) problems, especially infection. You may be asked to use a  spirometer: After a surgery. If you have a lung problem or a history of smoking. After a long period of time when you have been unable to move or be active. If the spirometer includes an indicator to show the highest number that you have reached, your health care provider or respiratory therapist will help you set a goal. Keep a log of your progress as told by your health care provider. What are the risks? Breathing too quickly may cause dizziness or cause you to pass out. Take your time so you do not get dizzy or light-headed. If you are in pain, you may need to take pain medicine before doing incentive spirometry. It is harder to take a deep breath if you are having pain. How to use your incentive spirometer  Sit up on the edge of your bed or on a chair. Hold the incentive spirometer so that it is in an upright position. Before you use the spirometer, breathe out normally. Place the mouthpiece in your mouth. Make sure your lips are closed tightly around it. Breathe in slowly and as deeply as you can through your mouth, causing the piston or the ball to rise toward the  top of the chamber. Hold your breath for 3-5 seconds, or for as long as possible. If the spirometer includes a coach indicator, use this to guide you in breathing. Slow down your breathing if the indicator goes above the marked areas. Remove the mouthpiece from your mouth and breathe out normally. The piston or ball will return to the bottom of the chamber. Rest for a few seconds, then repeat the steps 10 or more times. Take your time and take a few normal breaths between deep breaths so that you do not get dizzy or light-headed. Do this every 1-2 hours when you are awake. If the spirometer includes a goal marker to show the highest number you have reached (best effort), use this as a goal to work toward during each repetition. After each set of 10 deep breaths, cough a few times. This will help to make sure that your lungs are  clear. If you have an incision on your chest or abdomen from surgery, place a pillow or a rolled-up towel firmly against the incision when you cough. This can help to reduce pain while taking deep breaths and coughing. General tips When you are able to get out of bed: Walk around often. Continue to take deep breaths and cough in order to clear your lungs. Keep using the incentive spirometer until your health care provider says it is okay to stop using it. If you have been in the hospital, you may be told to keep using the spirometer at home. Contact a health care provider if: You are having difficulty using the spirometer. You have trouble using the spirometer as often as instructed. Your pain medicine is not giving enough relief for you to use the spirometer as told. You have a fever. Get help right away if: You develop shortness of breath. You develop a cough with bloody mucus from the lungs. You have fluid or blood coming from an incision site after you cough. Summary An incentive spirometer is a tool that can help you learn to take long, deep breaths to keep your lungs clear and active. You may be asked to use a spirometer after a surgery, if you have a lung problem or a history of smoking, or if you have been inactive for a long period of time. Use your incentive spirometer as instructed every 1-2 hours while you are awake. If you have an incision on your chest or abdomen, place a pillow or a rolled-up towel firmly against your incision when you cough. This will help to reduce pain. Get help right away if you have shortness of breath, you cough up bloody mucus, or blood comes from your incision when you cough. This information is not intended to replace advice given to you by your health care provider. Make sure you discuss any questions you have with your health care provider. Document Revised: 06/27/2019 Document Reviewed: 06/27/2019 Elsevier Patient Education  [place].

## 2022-03-20 ENCOUNTER — Encounter
Admission: RE | Admit: 2022-03-20 | Discharge: 2022-03-20 | Disposition: A | Discharge status: Home / Self Care | Payer: Federal, State, Local not specified - PPO | Source: Ambulatory Visit | Attending: Podiatry | Admitting: Podiatry

## 2022-03-20 ENCOUNTER — Encounter: Payer: Self-pay | Admitting: Urgent Care

## 2022-03-20 DIAGNOSIS — I10 Essential (primary) hypertension: Secondary | ICD-10-CM | POA: Insufficient documentation

## 2022-03-20 DIAGNOSIS — Z01812 Encounter for preprocedural laboratory examination: Secondary | ICD-10-CM | POA: Insufficient documentation

## 2022-03-20 DIAGNOSIS — Z79899 Other long term (current) drug therapy: Secondary | ICD-10-CM | POA: Insufficient documentation

## 2022-03-20 LAB — BASIC METABOLIC PANEL
Anion gap: 5 (ref 5–15)
BUN: 14 mg/dL (ref 6–20)
CO2: 26 mmol/L (ref 22–32)
Calcium: 9.5 mg/dL (ref 8.9–10.3)
Chloride: 111 mmol/L (ref 98–111)
Creatinine, Ser: 0.85 mg/dL (ref 0.44–1.00)
GFR, Estimated: >60 mL/min (ref >60)
Glucose, Bld: 106 mg/dL — ABNORMAL HIGH (ref 70–99)
Potassium: 4.1 mmol/L (ref 3.5–5.1)
Sodium: 142 mmol/L (ref 135–145)

## 2022-03-20 NOTE — Progress Notes (Signed)
Perioperative Services  Pre-Admission/Anesthesia Testing Clinical Review  Date: 03/20/22  Patient Demographics:  Name: Valerie Braun DOB:   [birth year] MRN:   [id number]  Planned Surgical Procedure(s):    Case: [id number] Date/Time: 03/28/22 0715   Procedures:      24268 - GASTROC RECESSION (Left)     34196 - FLEXOR DIGITORUM LONGUS (Left)     CALCANEAL OSTEOTOMY (Left)     22297 - COTTON OSTEOTOMY (Left)   Anesthesia type: Choice   Pre-op diagnosis: Acquired equinus deformity of left foot Tibialis tendinitis of left lower extremity Pes planus of left foot   Location: [facility] OR ROOM 02 / [place] ORS FOR ANESTHESIA GROUP   Surgeons: Samara Deist, DPM   NOTE: Available PAT nursing documentation and vital signs have been reviewed. Clinical nursing staff has updated patient's PMH/PSHx, current medication list, and drug allergies/intolerances to ensure comprehensive history available to assist in medical decision making as it pertains to the aforementioned surgical procedure and anticipated anesthetic course. Extensive review of available clinical information performed. [place] [facility] and PSHx updated with any diagnoses/procedures that  may have been inadvertently omitted during her intake with the pre-admission testing department's nursing staff.  Clinical Discussion:  Valerie Braun is a 56 y.o. female who is submitted for pre-surgical anesthesia review and clearance prior to her undergoing the above procedure. Patient has never been a smoker. Pertinent PMH includes: CAD, NSTEMI, diastolic dysfunction, HTN, HLD, prediabetes, dyspnea, OSAH (requires nocturnal PAP therapy), GERD (uses CaCO3 tablets as needed), OA.  Patient is followed by cardiology Edwin Dada, MD). She was last seen in the cardiology clinic on 09/23/2021; notes reviewed.  At the time of her clinic visit, patient denied any episodes of chest pain.  She had chronic exertional dyspnea and lower extremity edema.  She denied any PND,  orthopnea, palpitations, vertiginous symptoms, or presyncope/syncope.  Patient with a past medical history significant for cardiovascular diagnoses.  Patient suffered an NSTEMI on 01/09/2020.  Diagnostic left heart catheterization was performed on 01/10/2020 revealing a mildly reduced left ventricular systolic function with an EF of 45%.  There were 100% occlusions of both the distal LAD and second diagonal.  Cardiologist reviewed findings with [facility], as patient was felt to be high risk for definitive intervention with PCI.  Given that collaterals had already formed, the decision was made to defer PCI opting for aggressive medical management.  Myocardial perfusion imaging study performed on 02/16/2020 revealed a normal left ventricular systolic function with an EF of 55-65%.  There was no ST deviation noted during stress.  Blood pressure demonstrated normal response to exercise.  Findings within the study consistent with previous myocardial infarction.  Stress echocardiogram was performed on 08/16/2021 revealing a normal left ventricular systolic function with an EF of >55%.  There were no regional wall motion abnormalities.  Trivial mitral valve and moderate tricuspid valve regurgitation noted.  Patient able to complete 10.30 METS of activity without angina/anginal equivalent symptoms.  Blood pressure well controlled at 111/73 mmHg on currently prescribed CCB (amlodipine), ARB (losartan), beta-blocker (metoprolol succinate), and diuretic (spironolactone) therapies.  Patient is on rosuvastatin + ezetimibe + icosapent ethyl for her HLD and further ASCVD prevention.  Patient has a supply of SL nitrates (NTG to use on a as needed basis; denied recent use.  Patient with a diagnosis of prediabetes; last HgbA1c was 6.0% when checked on 08/08/2021.  Previously on SGLT2i, however this has been discontinued at this point.  Functional capacity has been limited since recent shoulder  surgery.  Also having pain in her  foot.  With that being said, patient still felt to be able to achieve at least 4 METS of physical activity without experiencing angina/anginal equivalent symptoms.  No changes were made to her medication regimen.  Patient to follow-up with outpatient cardiology in 6 months or sooner if needed.    Valerie Braun is scheduled for an elective LEFT GASTROC RECESSION; LEFT FLEXOR DIGITORUM LONGUS; LEFT CALCANEAL OSTEOTOMY; LEFT COTTON OSTEOTOMY on 03/28/2022 with Dr. Samara Deist, DPM. Given patient's past medical history significant for cardiovascular diagnoses, presurgical cardiac clearance was sought by the PAT team. Per cardiology, "Ms. Lines is cleared for surgery on 03/28/2022.  No further testing by our office at this time".  In review of her medication reconciliation, it is noted the patient is on daily antiplatelet therapy.  Patient has been cleared by cardiology to hold her daily low-dose ASA for 5 to 7 days prior to surgery should it be required by her attending surgeon.  Surgeons office has been contacted and patient to stop her daily low-dose ASA 7 days before surgery.  Her last dose of ASA should be on 03/20/2022.  Patient denies previous perioperative complications with anesthesia in the past.  In review of her available EMR, patient underwent a general + regional anesthetic course here at [facility] (ASA III) in 08/2021 with no documented complications.     03/19/2022   12:00 PM 01/12/2022    4:30 PM 01/12/2022    4:15 PM  Vitals with BMI  Height '5\' 4"'$     Weight 200 lbs 13 oz    BMI 94.49    Systolic  675   Diastolic  68   Pulse  60 60    Providers/Specialists:   NOTE: Primary physician provider listed below. Patient may have been seen by APP or partner within same practice.   PROVIDER ROLE / SPECIALTY LAST Montel Culver, DPM Podiatry (Surgeon) 03/11/2022  Marinda Elk, MD Primary Care Provider 02/13/2022  Cloretta Ned, MD  Cardiology 09/23/2021   Allergies:  Other and Red dye  Current Home Medications:   No current facility-administered medications for this encounter.    amLODipine (NORVASC) 5 MG tablet   aspirin EC 81 MG EC tablet   Calcium Carb-Cholecalciferol (CALCIUM-VITAMIN D) 500-200 MG-UNIT tablet   Coenzyme Q10 100 MG capsule   Cyanocobalamin (VITAMIN B-12 PO)   esomeprazole (NEXIUM) 40 MG capsule   ezetimibe (ZETIA) 10 MG tablet   fluticasone (FLONASE) 50 MCG/ACT nasal spray   gabapentin (NEURONTIN) 100 MG capsule   Ginkgo Biloba 120 MG TABS   Ibuprofen-diphenhydrAMINE HCl (ADVIL PM) 200-25 MG CAPS   icosapent Ethyl (VASCEPA) 1 g capsule   linaclotide (LINZESS) 145 MCG CAPS capsule   losartan (COZAAR) 50 MG tablet   metoprolol succinate (TOPROL-XL) 50 MG 24 hr tablet   Multiple Vitamin (MULTI-VITAMINS) TABS   nitroGLYCERIN (NITROSTAT) 0.4 MG SL tablet   Olopatadine HCl 0.6 % SOLN   rosuvastatin (CRESTOR) 5 MG tablet   spironolactone (ALDACTONE) 25 MG tablet   History:   Past Medical History:  Diagnosis Date   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   Diastolic dysfunction  a.) TTE 01/09/2020: EF 55-60%; mild BAE; mild MR; G1DD.   Dyspnea    Fatigue 08/25/2013   GERD (gastroesophageal reflux disease)    History of kidney stones    Hypertension    Lymphedema    NSTEMI (non-ST elevated myocardial infarction) ([place]) 01/09/2020   a.) LHC 01/10/2020: EF 45%; 100% dLAD and 100% D2; collaterals present, thus intervention deffered opting for medical mgmt.   OSA on CPAP    Pre-diabetes    Past Surgical History:  Procedure Laterality Date   CARPAL TUNNEL RELEASE Right 09/03/2021   Procedure: CARPAL TUNNEL RELEASE  ENDOSCOPIC;  Surgeon: Corky Mull, MD;  Location: [facility] ORS;  Service: Orthopedics;  Laterality: Right;   COLONOSCOPY     LEFT HEART CATH AND CORONARY ANGIOGRAPHY N/A 01/10/2020   Procedure: LEFT HEART CATH AND CORONARY ANGIOGRAPHY;  Surgeon: Dionisio David, MD;  Location: [place] [facility];  Service: Cardiovascular;  Laterality: N/A;   SHOULDER ARTHROSCOPY WITH SUBACROMIAL DECOMPRESSION, ROTATOR CUFF REPAIR AND BICEP TENDON REPAIR Right 09/03/2021   Procedure: RIGHT SHOULDER ARTHROSCOPY WITH DEBRIDEMENT AND DECOMPRESSION, ROTATOR CUFF REPAIR, AND BICEPS TENODESIS;  Surgeon: Corky Mull, MD;  Location: [facility] ORS;  Service: Orthopedics;  Laterality: Right;   TUBAL LIGATION     Family History  Adopted: Yes  Problem Relation Age of Onset   Prostate cancer Maternal Uncle    Tuberculosis Maternal Grandfather    Hypertension Mother    Anemia Mother    Breast cancer Neg Hx    Social History   Tobacco Use   Smoking status: Never   Smokeless tobacco: Never  Substance Use Topics   Alcohol use: Yes    Comment: rarely   Drug use: No    Pertinent Clinical Results:  LABS: Labs reviewed: Acceptable for surgery.  Lab Results  Component Value Date   WBC 9.3 01/12/2022   HGB 13.6 01/12/2022   HCT 41.6 01/12/2022   MCV 92.9 01/12/2022   PLT 262 01/12/2022   Lab Results  Component Value Date   NA 142 03/20/2022   K 4.1 03/20/2022   CO2 26 03/20/2022   GLUCOSE 106 (H) 03/20/2022   BUN 14 03/20/2022   CREATININE 0.85 03/20/2022   CALCIUM 9.5 03/20/2022   GFRNONAA >60 03/20/2022    ECG: Date: 01/12/2022 Rate: 76 bpm Rhythm: normal sinus; LVH Axis (leads I and aVF): Normal Intervals: PR 164 ms. QRS 76 ms. QTc 406 ms. ST segment and T wave changes: No evidence of acute ST segment elevation or depression Comparison: Similar to previous tracing obtained on 10/02/2020   IMAGING / PROCEDURES: DIAGNOSTIC RADIOGRAPHS OF CHEST 2 VIEWS performed on 01/12/2022 The heart size and  mediastinal contours are within normal limits. Both lungs are clear.  The visualized skeletal structures are unremarkable. No active cardiopulmonary disease   STRESS ECHOCARDIOGRAM performed on 08/16/2021 LVEF >55%  Normal LA pressures with normal diastolic function Trivial MR Moderate TR No AR or PR No valvular stenosis Maximum workload of 10.30 METS was achieved during exercise Normal stress test with no wall motion abnormalities at rest or with peak stress Appropriate blood pressure response  MRI SHOULDER RIGHT WO CONTRAST performed on 07/31/2021 Moderate tendinosis of the supraspinatus tendon with a large full-thickness, near complete, tear of the supraspinatus tendon with a few intact posterior fibers and 16 mm of retraction. Moderate tendinosis of the infraspinatus tendon with a large interstitial tear of the musculotendinous junction. Mild tendinosis of the subscapularis tendon Moderate tendinosis of the intra-articular  portion of the long head of the biceps tendon  DIAGNOSTIC RADIOGRAPHS OF RIGHT SHOULDER MINIMUM 2 VIEWS performed on 07/17/2021 Mild degenerative changes Small osteophyte off the inferior aspect of the glenoid Subacromial space is mildly decreased No subacromial or infraclavicular spurring Demonstrates a type I acromion  Impression and Plan:  INDIYA IZQUIERDO has been referred for pre-anesthesia review and clearance prior to her undergoing the planned anesthetic and procedural courses. Available labs, pertinent testing, and imaging results were personally reviewed by me. This patient has been appropriately cleared by cardiology with an overall ACCEPTABLE risk of significant perioperative cardiovascular complications.  Based on clinical review performed today (03/20/22), barring any significant acute changes in the patient's overall condition, it is anticipated that she will be able to proceed with the planned surgical intervention. Any acute changes in clinical  condition may necessitate her procedure being postponed and/or cancelled. Patient will meet with anesthesia team (MD and/or CRNA) on the day of her procedure for preoperative evaluation/assessment. Questions regarding anesthetic course will be fielded at that time.   Pre-surgical instructions were reviewed with the patient during her PAT appointment and questions were fielded by PAT clinical staff. Patient was advised that if any questions or concerns arise prior to her procedure then she should return a call to PAT and/or her surgeon's office to discuss.  Honor Loh, MSN, APRN, FNP-C, CEN [facility]  Peri-operative Services Nurse Practitioner Phone: [phone] Fax: [phone] 03/20/22 12:40 PM  NOTE: This note has been prepared using Dragon dictation software. Despite my best ability to proofread, there is always the potential that unintentional transcriptional errors may still occur from this process.

## 2022-03-27 MED ORDER — CEFAZOLIN SODIUM-DEXTROSE 2-4 GM/100ML-% IV SOLN
2.0000 g | INTRAVENOUS | Status: AC
  Administered 2022-03-28: 2 g via INTRAVENOUS

## 2022-03-27 MED ORDER — LACTATED RINGERS IV SOLN: INTRAVENOUS | Status: DC

## 2022-03-28 ENCOUNTER — Encounter: Payer: Self-pay | Admitting: Podiatry

## 2022-03-28 ENCOUNTER — Ambulatory Visit: Payer: Federal, State, Local not specified - PPO | Admitting: Urgent Care

## 2022-03-28 ENCOUNTER — Other Ambulatory Visit: Payer: Self-pay

## 2022-03-28 ENCOUNTER — Ambulatory Visit: Payer: Federal, State, Local not specified - PPO

## 2022-03-28 ENCOUNTER — Encounter
Admission: RE | Disposition: A | Discharge status: Home / Self Care | Payer: Self-pay | Source: Ambulatory Visit | Attending: Podiatry

## 2022-03-28 ENCOUNTER — Ambulatory Visit
Admission: RE | Admit: 2022-03-28 | Discharge: 2022-03-28 | Disposition: A | Discharge status: Home / Self Care | Payer: Federal, State, Local not specified - PPO | Source: Ambulatory Visit | Attending: Podiatry | Admitting: Podiatry

## 2022-03-28 DIAGNOSIS — E785 Hyperlipidemia, unspecified: Secondary | ICD-10-CM | POA: Insufficient documentation

## 2022-03-28 DIAGNOSIS — K219 Gastro-esophageal reflux disease without esophagitis: Secondary | ICD-10-CM | POA: Insufficient documentation

## 2022-03-28 DIAGNOSIS — I252 Old myocardial infarction: Secondary | ICD-10-CM | POA: Diagnosis not present

## 2022-03-28 DIAGNOSIS — R7303 Prediabetes: Secondary | ICD-10-CM | POA: Diagnosis not present

## 2022-03-28 DIAGNOSIS — I2510 Atherosclerotic heart disease of native coronary artery without angina pectoris: Secondary | ICD-10-CM | POA: Diagnosis not present

## 2022-03-28 DIAGNOSIS — M62462 Contracture of muscle, left lower leg: Secondary | ICD-10-CM | POA: Insufficient documentation

## 2022-03-28 DIAGNOSIS — G4733 Obstructive sleep apnea (adult) (pediatric): Secondary | ICD-10-CM | POA: Insufficient documentation

## 2022-03-28 DIAGNOSIS — I119 Hypertensive heart disease without heart failure: Secondary | ICD-10-CM | POA: Insufficient documentation

## 2022-03-28 DIAGNOSIS — I10 Essential (primary) hypertension: Secondary | ICD-10-CM | POA: Diagnosis not present

## 2022-03-28 DIAGNOSIS — M2142 Flat foot [pes planus] (acquired), left foot: Secondary | ICD-10-CM | POA: Diagnosis not present

## 2022-03-28 HISTORY — PX: GASTROC RECESSION EXTREMITY: SHX6262

## 2022-03-28 HISTORY — PX: TENOTOMY / FLEXOR TENDON TRANSFER: SHX6629

## 2022-03-28 HISTORY — PX: CALCANEAL OSTEOTOMY: SHX1281

## 2022-03-28 SURGERY — RECESSION, TENDON, GASTROCNEMIUS
Anesthesia: General | Site: Foot | Laterality: Left

## 2022-03-28 MED ORDER — CHLORHEXIDINE GLUCONATE 0.12 % MT SOLN
OROMUCOSAL | Status: AC
  Administered 2022-03-28: 15 mL
  Filled 2022-03-28: qty 15

## 2022-03-28 MED ORDER — CEFAZOLIN SODIUM-DEXTROSE 2-4 GM/100ML-% IV SOLN
INTRAVENOUS | Status: AC
  Filled 2022-03-28: qty 100

## 2022-03-28 MED ORDER — METOCLOPRAMIDE HCL 5 MG/ML IJ SOLN: 5.0000 mg | Freq: Three times a day (TID) | INTRAMUSCULAR | Status: DC | PRN

## 2022-03-28 MED ORDER — BUPIVACAINE HCL (PF) 0.5 % IJ SOLN
INTRAMUSCULAR | Status: DC | PRN
  Administered 2022-03-28: 10 mL

## 2022-03-28 MED ORDER — FENTANYL CITRATE (PF) 100 MCG/2ML IJ SOLN
INTRAMUSCULAR | Status: AC
  Filled 2022-03-28: qty 2

## 2022-03-28 MED ORDER — BUPIVACAINE HCL (PF) 0.5 % IJ SOLN
INTRAMUSCULAR | Status: AC
  Filled 2022-03-28: qty 60

## 2022-03-28 MED ORDER — BUPIVACAINE HCL (PF) 0.5 % IJ SOLN
INTRAMUSCULAR | Status: AC
  Filled 2022-03-28: qty 10

## 2022-03-28 MED ORDER — MIDAZOLAM HCL 2 MG/2ML IJ SOLN
1.0000 mg | INTRAMUSCULAR | Status: AC | PRN
  Administered 2022-03-28: 1 mg via INTRAVENOUS

## 2022-03-28 MED ORDER — BUPIVACAINE LIPOSOME 1.3 % IJ SUSP
INTRAMUSCULAR | Status: DC | PRN
  Administered 2022-03-28: 10 mL via PERINEURAL

## 2022-03-28 MED ORDER — LIDOCAINE HCL (PF) 1 % IJ SOLN
INTRAMUSCULAR | Status: AC
  Filled 2022-03-28: qty 5

## 2022-03-28 MED ORDER — FENTANYL CITRATE (PF) 100 MCG/2ML IJ SOLN
INTRAMUSCULAR | Status: DC | PRN
  Administered 2022-03-28 (×2): 50 ug via INTRAVENOUS

## 2022-03-28 MED ORDER — BUPIVACAINE LIPOSOME 1.3 % IJ SUSP
INTRAMUSCULAR | Status: AC
  Filled 2022-03-28: qty 20

## 2022-03-28 MED ORDER — METOCLOPRAMIDE HCL 10 MG PO TABS: 5.0000 mg | ORAL_TABLET | Freq: Three times a day (TID) | ORAL | Status: DC | PRN

## 2022-03-28 MED ORDER — OXYCODONE-ACETAMINOPHEN 5-325 MG PO TABS: 1.0000 | ORAL_TABLET | Freq: Four times a day (QID) | ORAL | 0 refills | Status: AC | PRN

## 2022-03-28 MED ORDER — ONDANSETRON HCL 4 MG/2ML IJ SOLN: 4.0000 mg | Freq: Four times a day (QID) | INTRAMUSCULAR | Status: DC | PRN

## 2022-03-28 MED ORDER — ROCURONIUM BROMIDE 100 MG/10ML IV SOLN
INTRAVENOUS | Status: DC | PRN
  Administered 2022-03-28: 50 mg via INTRAVENOUS

## 2022-03-28 MED ORDER — ONDANSETRON HCL 4 MG PO TABS: 4.0000 mg | ORAL_TABLET | Freq: Four times a day (QID) | ORAL | Status: DC | PRN

## 2022-03-28 MED ORDER — LIDOCAINE HCL (PF) 1 % IJ SOLN
INTRAMUSCULAR | Status: DC | PRN
  Administered 2022-03-28 (×2): 1 mL via SUBCUTANEOUS

## 2022-03-28 MED ORDER — BUPIVACAINE HCL (PF) 0.5 % IJ SOLN
INTRAMUSCULAR | Status: DC | PRN
  Administered 2022-03-28: 10 mL via PERINEURAL

## 2022-03-28 MED ORDER — PHENYLEPHRINE 80 MCG/ML (10ML) SYRINGE FOR IV PUSH (FOR BLOOD PRESSURE SUPPORT)
PREFILLED_SYRINGE | INTRAVENOUS | Status: AC
  Filled 2022-03-28: qty 10

## 2022-03-28 MED ORDER — DEXMEDETOMIDINE HCL IN NACL 80 MCG/20ML IV SOLN
INTRAVENOUS | Status: DC | PRN
  Administered 2022-03-28: 8 ug via BUCCAL

## 2022-03-28 MED ORDER — LIDOCAINE HCL (PF) 1 % IJ SOLN
INTRAMUSCULAR | Status: AC
  Filled 2022-03-28: qty 60

## 2022-03-28 MED ORDER — PHENYLEPHRINE 80 MCG/ML (10ML) SYRINGE FOR IV PUSH (FOR BLOOD PRESSURE SUPPORT)
PREFILLED_SYRINGE | INTRAVENOUS | Status: DC | PRN
  Administered 2022-03-28: 80 ug via INTRAVENOUS

## 2022-03-28 MED ORDER — LIDOCAINE-EPINEPHRINE 1 %-1:100000 IJ SOLN
INTRAMUSCULAR | Status: AC
  Filled 2022-03-28: qty 1

## 2022-03-28 MED ORDER — SEVOFLURANE IN SOLN
RESPIRATORY_TRACT | Status: AC
  Filled 2022-03-28: qty 250

## 2022-03-28 MED ORDER — BUPIVACAINE LIPOSOME 1.3 % IJ SUSP
INTRAMUSCULAR | Status: AC
  Filled 2022-03-28: qty 10

## 2022-03-28 MED ORDER — ORAL CARE MOUTH RINSE: 15.0000 mL | Freq: Once | OROMUCOSAL | Status: DC

## 2022-03-28 MED ORDER — SUCCINYLCHOLINE CHLORIDE 200 MG/10ML IV SOSY
PREFILLED_SYRINGE | INTRAVENOUS | Status: DC | PRN
  Administered 2022-03-28: 120 mg via INTRAVENOUS

## 2022-03-28 MED ORDER — PROPOFOL 10 MG/ML IV BOLUS
INTRAVENOUS | Status: AC
  Filled 2022-03-28: qty 40

## 2022-03-28 MED ORDER — BUPIVACAINE-EPINEPHRINE (PF) 0.25% -1:200000 IJ SOLN
INTRAMUSCULAR | Status: DC | PRN
  Administered 2022-03-28: 5 mL via PERINEURAL

## 2022-03-28 MED ORDER — SUGAMMADEX SODIUM 200 MG/2ML IV SOLN
INTRAVENOUS | Status: DC | PRN
  Administered 2022-03-28: 200 mg via INTRAVENOUS

## 2022-03-28 MED ORDER — LIDOCAINE HCL (CARDIAC) PF 100 MG/5ML IV SOSY
PREFILLED_SYRINGE | INTRAVENOUS | Status: DC | PRN
  Administered 2022-03-28: 100 mg via INTRAVENOUS

## 2022-03-28 MED ORDER — BUPIVACAINE-EPINEPHRINE (PF) 0.25% -1:200000 IJ SOLN
INTRAMUSCULAR | Status: AC
  Filled 2022-03-28: qty 30

## 2022-03-28 MED ORDER — DEXAMETHASONE SODIUM PHOSPHATE 10 MG/ML IJ SOLN
INTRAMUSCULAR | Status: DC | PRN
  Administered 2022-03-28: 10 mg via INTRAVENOUS

## 2022-03-28 MED ORDER — MIDAZOLAM HCL 2 MG/2ML IJ SOLN
INTRAMUSCULAR | Status: AC
  Filled 2022-03-28: qty 2

## 2022-03-28 MED ORDER — MIDAZOLAM HCL 2 MG/2ML IJ SOLN
INTRAMUSCULAR | Status: AC
  Administered 2022-03-28: 1 mg via INTRAVENOUS
  Filled 2022-03-28: qty 2

## 2022-03-28 MED ORDER — PROPOFOL 10 MG/ML IV BOLUS
INTRAVENOUS | Status: DC | PRN
  Administered 2022-03-28: 160 mg via INTRAVENOUS
  Administered 2022-03-28: 50 mg via INTRAVENOUS
  Administered 2022-03-28: 70 mg via INTRAVENOUS

## 2022-03-28 SURGICAL SUPPLY — 96 items
ANCH SUT 4.5 FTPRNT PEEK-OPTM (Anchor) ×1 IMPLANT
ANCHOR 4.5 FOOTPRINT ULTRA (Anchor) IMPLANT
ANCHOR SUT KNTLS SPEEDLOCK (Anchor) ×1 IMPLANT
APL SKNCLS STERI-STRIP NONHPOA (GAUZE/BANDAGES/DRESSINGS) ×2
BENZOIN TINCTURE PRP APPL 2/3 (GAUZE/BANDAGES/DRESSINGS) ×1 IMPLANT
BIT DRILL 4X4.5 FOOTPRINT STR (BIT) IMPLANT
BIT DRILL SOLID 2.0 X 110MM (DRILL) IMPLANT
BLADE MED AGGRESSIVE (BLADE) ×1 IMPLANT
BLADE OSCILLATING/SAGITTAL (BLADE)
BLADE SURG 15 STRL LF DISP TIS (BLADE) ×1 IMPLANT
BLADE SURG 15 STRL SS (BLADE) ×2
BLADE SURG MINI STRL (BLADE) ×1 IMPLANT
BLADE SW THK.38XMED LNG THN (BLADE) ×1 IMPLANT
BNDG CMPR 5X4 CHSV STRCH STRL (GAUZE/BANDAGES/DRESSINGS) ×1
BNDG CMPR 75X21 PLY HI ABS (MISCELLANEOUS) ×1
BNDG CMPR STD VLCR NS LF 5.8X4 (GAUZE/BANDAGES/DRESSINGS) ×1
BNDG COHESIVE 4X5 TAN STRL LF (GAUZE/BANDAGES/DRESSINGS) ×1 IMPLANT
BNDG ELASTIC 4X5.8 VLCR NS LF (GAUZE/BANDAGES/DRESSINGS) ×1 IMPLANT
BNDG ESMARK 4X12 TAN STRL LF (GAUZE/BANDAGES/DRESSINGS) ×1 IMPLANT
BNDG GAUZE DERMACEA FLUFF 4 (GAUZE/BANDAGES/DRESSINGS) ×1 IMPLANT
BNDG GZE 12X3 1 PLY HI ABS (GAUZE/BANDAGES/DRESSINGS) ×1
BNDG GZE DERMACEA 4 6PLY (GAUZE/BANDAGES/DRESSINGS) ×1
BNDG STRETCH GAUZE 3IN X12FT (GAUZE/BANDAGES/DRESSINGS) ×1 IMPLANT
BOOT STEPPER DURA MED (SOFTGOODS) IMPLANT
BUR 4X55 1 (BURR) ×1 IMPLANT
COTTON WEDGE 25X10 6 THK (Bone Implant) ×1 IMPLANT
CUFF TOURN SGL QUICK 18X4 (TOURNIQUET CUFF) ×1 IMPLANT
CUFF TOURN SGL QUICK 24 (TOURNIQUET CUFF)
CUFF TRNQT CYL 24X4X16.5-23 (TOURNIQUET CUFF) IMPLANT
DRAPE EXTREMITY 106X87X128.5 (DRAPES) IMPLANT
DRAPE FLUOR MINI C-ARM 54X84 (DRAPES) ×1 IMPLANT
DRAPE IMP U-DRAPE 54X76 (DRAPES) IMPLANT
DRILL 4X4.5 FOOTPRINT STR (BIT) ×1
DRILL SOLID 2.0 X 110MM (DRILL) ×1
DRSG TEGADERM 4X4.75 (GAUZE/BANDAGES/DRESSINGS) ×1 IMPLANT
DRSG TELFA 3X4 N-ADH STERILE (GAUZE/BANDAGES/DRESSINGS) IMPLANT
DURAPREP 26ML APPLICATOR (WOUND CARE) ×1 IMPLANT
ELECT REM PT RETURN 9FT ADLT (ELECTROSURGICAL) ×1
ELECTRODE REM PT RTRN 9FT ADLT (ELECTROSURGICAL) ×1 IMPLANT
GAUZE 4X4 16PLY ~~LOC~~+RFID DBL (SPONGE) IMPLANT
GAUZE SPONGE 4X4 12PLY STRL (GAUZE/BANDAGES/DRESSINGS) ×1 IMPLANT
GAUZE STRETCH 2X75IN STRL (MISCELLANEOUS) ×1 IMPLANT
GAUZE XEROFORM 1X8 LF (GAUZE/BANDAGES/DRESSINGS) ×1 IMPLANT
GLOVE BIO SURGEON STRL SZ7.5 (GLOVE) ×1 IMPLANT
GLOVE SURG UNDER LTX SZ8 (GLOVE) ×1 IMPLANT
GOWN STRL REUS W/ TWL XL LVL3 (GOWN DISPOSABLE) ×2 IMPLANT
GOWN STRL REUS W/TWL XL LVL3 (GOWN DISPOSABLE) ×2
K-WIRE SMOOTH TROCAR 2.0X150 (WIRE) ×2
KIT SHOULDER TRACTION (DRAPES) ×1 IMPLANT
KIT TURNOVER KIT A (KITS) ×1 IMPLANT
KWIRE SMOOTH TROCAR 2.0X150 (WIRE) IMPLANT
LABEL OR SOLS (LABEL) ×1 IMPLANT
MANIFOLD NEPTUNE II (INSTRUMENTS) ×1 IMPLANT
NDL FILTER BLUNT 18X1 1/2 (NEEDLE) ×1 IMPLANT
NDL HYPO 25X1 1.5 SAFETY (NEEDLE) ×1 IMPLANT
NDL SAFETY ECLIP 18X1.5 (MISCELLANEOUS) ×1 IMPLANT
NEEDLE FILTER BLUNT 18X1 1/2 (NEEDLE) ×1 IMPLANT
NEEDLE HYPO 25X1 1.5 SAFETY (NEEDLE) ×1 IMPLANT
NS IRRIG 500ML POUR BTL (IV SOLUTION) ×1 IMPLANT
PACK EXTREMITY ARMC (MISCELLANEOUS) ×1 IMPLANT
PAD PREP 24X41 OB/GYN DISP (PERSONAL CARE ITEMS) ×1 IMPLANT
PADDING CAST BLEND 4X4 NS (MISCELLANEOUS) ×1 IMPLANT
PENCIL SMOKE EVACUATOR (MISCELLANEOUS) IMPLANT
PLATE MEDIUM L 20 (Plate) IMPLANT
RASP SM TEAR CROSS CUT (RASP) ×1 IMPLANT
SCREW LOCK PLATE R3 2.7X16 (Screw) IMPLANT
SCREW LOCK PLATE R3 2.7X18 (Screw) IMPLANT
SPLINT CAST 1 STEP 4X30 (MISCELLANEOUS) IMPLANT
SPLINT PLASTER CAST FAST 5X30 (CAST SUPPLIES) IMPLANT
SPONGE T-LAP 18X18 ~~LOC~~+RFID (SPONGE) ×1 IMPLANT
STAPLER SKIN PROX 35W (STAPLE) ×1 IMPLANT
STOCKINETTE M/LG 89821 (MISCELLANEOUS) ×1 IMPLANT
STRIP CLOSURE SKIN 1/4X4 (GAUZE/BANDAGES/DRESSINGS) ×1 IMPLANT
SUT ETHILON 3-0 (SUTURE) ×1 IMPLANT
SUT MNCRL 4-0 (SUTURE) ×3
SUT MNCRL 4-0 27XMFL (SUTURE) ×3
SUT ULTRABRAID #2 38 (SUTURE) IMPLANT
SUT VIC AB 2-0 CT1 27 (SUTURE) ×1
SUT VIC AB 2-0 CT1 TAPERPNT 27 (SUTURE) ×1 IMPLANT
SUT VIC AB 2-0 SH 27 (SUTURE)
SUT VIC AB 2-0 SH 27XBRD (SUTURE) ×2 IMPLANT
SUT VIC AB 3-0 SH 27 (SUTURE) ×3
SUT VIC AB 3-0 SH 27X BRD (SUTURE) ×1 IMPLANT
SUT VICRYL 3-0 CR8 SH (SUTURE) ×2 IMPLANT
SUT VICRYL AB 3-0 FS1 BRD 27IN (SUTURE) ×1 IMPLANT
SUTURE MNCRL 4-0 27XMF (SUTURE) ×1 IMPLANT
SYR 10ML LL (SYRINGE) ×1 IMPLANT
SYR 3ML LL SCALE MARK (SYRINGE) ×1 IMPLANT
TRAP FLUID SMOKE EVACUATOR (MISCELLANEOUS) ×1 IMPLANT
TRAY FOLEY MTR SLVR 16FR STAT (SET/KITS/TRAYS/PACK) ×1 IMPLANT
TUBING CONNECTING 10 (TUBING) IMPLANT
WATER STERILE IRR 500ML POUR (IV SOLUTION) ×1 IMPLANT
WEDGE BONE 20MMH X 22MMD 8MMT (Tissue) IMPLANT
WEDGE COTTON 25X10 6 THK (Bone Implant) IMPLANT
WIRE OLIVE SMOOTH 1.4MMX60MM (WIRE) IMPLANT
WIRE Z .062 C-WIRE SPADE TIP (WIRE) IMPLANT

## 2022-03-28 NOTE — Anesthesia Procedure Notes (Addendum)
Anesthesia Regional Block: Popliteal block   Pre-Anesthetic Checklist: , timeout performed,  Correct Patient, Correct Site, Correct Laterality,  Correct Procedure, Correct Position, site marked,  Risks and benefits discussed,  Surgical consent,  Pre-op evaluation,  At surgeon's request and post-op pain management  Laterality: Upper and Left  Prep: chloraprep       Needles:  Injection technique: Single-shot  Needle Type: Stimiplex     Needle Length: 9cm  Needle Gauge: 22     Additional Needles:   Procedures:,,,, ultrasound used (permanent image in chart),,    Narrative:  Start time: 03/28/2022 7:31 AM End time: 03/28/2022 7:34 AM Injection made incrementally with aspirations every 5 mL.  Performed by: Personally  Anesthesiologist: Iran Ouch, MD  Additional Notes: Patient consented for risk and benefits of nerve block including but not limited to nerve damage, failed block, bleeding and infection.  Patient voiced understanding.  Functioning IV was confirmed and monitors were applied.  Timeout done prior to procedure and prior to any sedation being given to the patient.  Patient confirmed procedure site prior to any sedation given to the patient. Sterile prep,hand hygiene and sterile gloves were used.  Minimal sedation used for procedure.  No paresthesia endorsed by patient during the procedure.  Negative aspiration and negative test dose prior to incremental administration of local anesthetic. The patient tolerated the procedure well with no immediate complications.

## 2022-03-28 NOTE — Anesthesia Preprocedure Evaluation (Addendum)
Anesthesia Evaluation  Patient identified by MRN, date of birth, ID band Patient awake    Reviewed: Allergy & Precautions, NPO status , Patient's Chart, lab work & pertinent test results  History of Anesthesia Complications Negative for: history of anesthetic complications  Airway Mallampati: III  TM Distance: >3 FB Neck ROM: full    Dental no notable dental hx.    Pulmonary sleep apnea    Pulmonary exam normal breath sounds clear to auscultation       Cardiovascular Exercise Tolerance: Good hypertension, + CAD and + Past MI (2021 NSTEMI)  Normal cardiovascular exam Rhythm:Regular Rate:Normal  Echo 08/16/21:  NORMAL STRESS TEST. NORMAL RESTING STUDY WITH NO WALL MOTION ABNORMALITIES  AT REST AND PEAK STRESS.  NORMAL LA PRESSURES WITH NORMAL DIASTOLIC FUNCTION  VALVULAR REGURGITATION: TRIVIAL MR, MODERATE TR  NO VALVULAR STENOSIS  Maximum workload of 10.30 METs was achieved during exercise.  NORMAL RESTING BP - APPROPRIATE RESPONSE  ECG 08/14/21:  Normal sinus rhythm  Early precordial QRS transition  Left ventricular hypertrophy   LHC 01/10/2020: EF 45%; anteroapical HK; 100% dLAD, 100% D2; reviewed with [facility] d/t HIGH RISK PCI - collaterals already formed, thus intervention deferred opting for med mgmt.   Neuro/Psych negative neurological ROS  negative psych ROS   GI/Hepatic Neg liver ROS,GERD  Controlled,,  Endo/Other  negative endocrine ROS  Prediabetes   Renal/GU negative Renal ROS     Musculoskeletal  (+) Arthritis ,    Abdominal  (+) + obese  Peds  Hematology negative hematology ROS (+)   Anesthesia Other Findings GLP1 agonist yesterday. No symptoms of gastric retention. Ultrasound with minimal fluid detected in antrum on bedside ultrasound. Risks of aspiration were discussed and patient desired to continue.   Past Medical History: 01/19/2015: Adaptive colitis No date: Arthritis 08/25/2013:  Avitaminosis D No date: Carpal tunnel syndrome 01/10/2020: Coronary artery disease     Comment:  a.) LHC 01/10/2020: EF 45%; anteroapical HK; 100% dLAD,               100% D2; reviewed with [facility] d/t HIGH RISK PCI -               collaterals already formed, thus intervention deferred               opting for med mgmt. No date: Diastolic dysfunction     Comment:  a.) TTE 01/09/2020: EF 55-60%; mild BAE; mild MR; G1DD. No date: Dyspnea 08/25/2013: Fatigue No date: GERD (gastroesophageal reflux disease) No date: History of kidney stones No date: Hypertension No date: Lymphedema 01/09/2020: NSTEMI (non-ST elevated myocardial infarction) [facility])     Comment:  a.) LHC 01/10/2020: EF 45%; 100% dLAD and 100% D2;               collaterals present, thus intervention deffered opting               for medical mgmt. No date: OSA on CPAP No date: Pre-diabetes  Past Surgical History: 09/03/2021: CARPAL TUNNEL RELEASE; Right     Comment:  Procedure: CARPAL TUNNEL RELEASE ENDOSCOPIC;  Surgeon:               Corky Mull, MD;  Location: [facility] ORS;  Service:               Orthopedics;  Laterality: Right; No date: COLONOSCOPY 01/10/2020: LEFT HEART CATH AND CORONARY ANGIOGRAPHY; N/A     Comment:  Procedure: LEFT HEART CATH AND CORONARY ANGIOGRAPHY;  Surgeon: Dionisio David, MD;  Location: [place] [facility]              LAB;  Service: Cardiovascular;  Laterality: N/A; 09/03/2021: SHOULDER ARTHROSCOPY WITH SUBACROMIAL DECOMPRESSION,  ROTATOR CUFF REPAIR AND BICEP TENDON REPAIR; Right     Comment:  Procedure: RIGHT SHOULDER ARTHROSCOPY WITH DEBRIDEMENT               AND DECOMPRESSION, ROTATOR CUFF REPAIR, AND BICEPS               TENODESIS;  Surgeon: Corky Mull, MD;  Location: [facility]               ORS;  Service: Orthopedics;  Laterality: Right; No date: TUBAL LIGATION  BMI    Body Mass Index: 34.47 kg/m      Reproductive/Obstetrics negative OB ROS                              Anesthesia Physical Anesthesia Plan  ASA: 3  Anesthesia Plan: General ETT   Post-op Pain Management: Regional block* and Ofirmev IV (intra-op)*   Induction: Intravenous and Rapid sequence  PONV Risk Score and Plan: 3 and Ondansetron, Dexamethasone and Midazolam  Airway Management Planned: Oral ETT  Additional Equipment:   Intra-op Plan:   Post-operative Plan: Extubation in OR  Informed Consent: I have reviewed the patients History and Physical, chart, labs and discussed the procedure including the risks, benefits and alternatives for the proposed anesthesia with the patient or authorized representative who has indicated his/her understanding and acceptance.     Dental Advisory Given  Plan Discussed with: Anesthesiologist, CRNA and Surgeon  Anesthesia Plan Comments:         Anesthesia Quick Evaluation

## 2022-03-28 NOTE — Discharge Instructions (Addendum)
[place]  POST OPERATIVE INSTRUCTIONS FOR DR. Vickki Muff AND DR. [place]   Take your medication as prescribed.  Pain medication should be taken only as needed.  Keep the dressing clean, dry and intact.  Keep your foot elevated above the heart level for the first 48 hours.  We have instructed you to be non-weight bearing.  Always wear your post-op shoe when walking.  Always use your crutches if you are to be non-weight bearing.  Do not take a shower. Baths are permissible as long as the foot is kept out of the water.   Every hour you are awake:  Bend your knee 15 times.  Call [facility] [phone]) if any of the following problems occur: You develop a temperature or fever. The bandage becomes saturated with blood. Medication does not stop your pain. Injury of the foot occurs. Any symptoms of infection including redness, odor, or red streaks running from wound.  AMBULATORY SURGERY  DISCHARGE INSTRUCTIONS   The drugs that you were given will stay in your system until tomorrow so for the next 24 hours you should not:  Drive an automobile Make any legal decisions Drink any alcoholic beverage   You may resume regular meals tomorrow.  Today it is better to start with liquids and gradually work up to solid foods.  You may eat anything you prefer, but it is better to start with liquids, then soup and crackers, and gradually work up to solid foods.   Please notify your doctor immediately if you have any unusual bleeding, trouble breathing, redness and pain at the surgery site, drainage, fever, or pain not relieved by medication.    Additional Instructions:  Please contact your physician with any problems or Same Day Surgery at [phone], Monday through Friday 6 am to 4 pm, or [place] at [facility] number at [phone].

## 2022-03-28 NOTE — H&P (Signed)
HISTORY AND PHYSICAL INTERVAL NOTE:  03/28/2022  7:27 AM  Valerie Braun  has presented today for surgery, with the diagnosis of Acquired equinus deformity of left foot Tibialis tenndinnitis of left lower extremity Pes planus of left foot.  The various methods of treatment have been discussed with the patient.  No guarantees were given.  After consideration of risks, benefits and other options for treatment, the patient has consented to surgery.  I have reviewed the patients' chart and labs.     A history and physical examination was performed in my office.  The patient was reexamined.  There have been no changes to this history and physical examination.  Samara Deist A

## 2022-03-28 NOTE — Op Note (Signed)
Operative note   Surgeon:Darlette Dubow Lawyer: None    Preop diagnosis: 1.  Gastrocnemius equinus left lower extremity 2.  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  Strayer gastroc recession left lower extremity 2.  Evans calcaneal osteotomy left calcaneus 3.  Flexor digitorum longus transfer left medial ankle 4.  Cotton osteotomy left medial cuneiform 5.  Intraoperative fluoroscopy use without assistance of radiologist    EBL: Minimal    Anesthesia:regional and general a popliteal block was placed in the preoperative holding area with a saphenous nerve block.    Hemostasis: Thigh tourniquet inflated to 250 mmHg for 120 minutes    Specimen: None    Complications: None    Operative indications:Valerie Braun is an 56 y.o. that presents today for surgical intervention.  The risks/benefits/alternatives/complications have been discussed and consent has been given.    Procedure:  Patient was brought into the OR and placed on the operating table in theprone position. After anesthesia was obtained theleft lower extremity was prepped and draped in usual sterile fashion.  Attention was initially directed to the posterior aspect of the calf or the gastroc soleal junction a longitudinal incision was performed on the posterior aspect.  Sharp and blunt dissection carried down to the peritenon.  Care was taken to retract the small saphenous vein and sural nerve.  At this time the peritenon was retracted away.  A medial to lateral Strayer gastroc recession was performed.  Good excursion with exposure of the underlying muscle belly was noted.  The wound was flushed with copious amounts of irrigation.  3-0 Vicryl for the peritenon and subcutaneous tissue were used and a 4-0 Monocryl undyed.  This area was then covered with Telfa and a Tegaderm.  The patient was then flipped in the supine position.  Resterile prep and drape was  then performed.  Tension was directed laterally along the calcaneocuboid region where a longitudinal incision was performed.  Sharp and blunt dissection carried down to the calcaneus.  The peroneal tendons were retracted throughout the entire procedure.  At this time at approximately 1 cm proximal to the calcaneocuboid joint a lateral to medial osteotomy was created.  This was then opened and a Evans calcaneal wedge was placed from the [place] set.  This was an 8 mm wedge.  This was impacted into the site.  A small Evans plate from the [employer] 28 screw set was placed with 2.7 millimeter screws.  Good stability and alignment was noted in all planes.  This was visualized with fluoroscopy.  Closure of this area was performed with a 3-0 Vicryl the deep and subcutaneous tissue and a 4 Monocryl for the skin.  Attention was then directed to the medial aspect along the posterior tibial tendon where a longitudinal incision was made from the distal aspect of the medial malleolus coursing to the area of knot of Henry.  Sharp and blunt dissection carried down to the peritenon.  The posterior tibial tendon was noted.  The tendon was reflected away at the plantar aspect of the navicular tuberosity.  The flexor digitorum longus was then harvested into the deep arch.  This was stabilized with a [employer] 4.5 mm foot printbone anchor with the foot held in neutral inverted position.  Good stability was noted.  The wound was flushed with copious amounts of irrigation.  Layered closure was performed with a 3-0 Vicryl in the  deep and subcutaneous tissue and a 4-0 Monocryl for the skin.  Attention was then directed overlying the medial cuneiform.  A longitudinal incision was performed.  Sharp and blunt dissection carried down to the peritenon.  This was then reflected medial and lateral.  The proximal distal aspect of the medial cuneiform was mapped out as well as the lateral aspect.  Next a dorsal to plantar osteotomy was  created under fluoroscopy.  A 6 mm cotton wedge from the [employer] set was then placed and impacted into the site.  This was noted to be stable with good plantarflexion of the first metatarsal.  At this time all areas of the foot were then evaluated with fluoroscopy and found to be in good line position.  The hardware was intact to the calcaneus.  Good repositioning and coverage to the talonavicular joint.  Plantarflexion of the first metatarsal was noted.  The medial cuneiform site was then closed with a 3-0 Vicryl the deeper subcutaneous tissue and a 4 Monocryl for the skin.  A bulky sterile dressing was applied.  The patient was placed in a cam boot with the foot in a neutral position.     Patient tolerated the procedure and anesthesia well.  Was transported from the OR to the PACU with all vital signs stable and vascular status intact. To be discharged per routine protocol.  Will follow up in approximately 1 week in the outpatient clinic.

## 2022-03-28 NOTE — Anesthesia Procedure Notes (Addendum)
Procedure Name: Intubation Date/Time: 03/28/2022 7:55 AM  Performed by: Gigi Gin, CRNAPre-anesthesia Checklist: Patient identified, Emergency Drugs available, Suction available, Patient being monitored and Timeout performed Patient Re-evaluated:Patient Re-evaluated prior to induction Oxygen Delivery Method: Circle system utilized Preoxygenation: Pre-oxygenation with 100% oxygen Induction Type: IV induction Laryngoscope Size: McGraph and 4 Grade View: Grade III Tube type: Oral Number of attempts: 2 Airway Equipment and Method: Patient positioned with wedge pillow and Stylet Placement Confirmation: positive ETCO2, CO2 detector and breath sounds checked- equal and bilateral Secured at: 22 cm Tube secured with: Tape (Secured with extra reinforcement and Tegaderm dsgs over tape on both sides .) Dental Injury: Teeth and Oropharynx as per pre-operative assessment  Difficulty Due To: Difficulty was anticipated and Difficult Airway- due to anterior larynx Future Recommendations: Recommend- induction with short-acting agent, and alternative techniques readily available Comments: Short TMD with small chin.  RSI with cricoid pressure.  DL x 1 with MAC 3 and epiglottis only seen.   2nd DL with McGrath videoscope with glottic opening in view with redundant tissue and cords not in view with or w/o cricoid manipulation.   Eyes taped closed prior to intubation then padded for prone position after intubated.

## 2022-03-28 NOTE — Anesthesia Postprocedure Evaluation (Signed)
Anesthesia Post Note  Patient: Valerie Braun  Procedure(s) Performed: 73668 - GASTROC RECESSION (Left: Foot) [phone] - FLEXOR DIGITORUM LONGUS (Left: Foot) CALCANEAL OSTEOTOMY (Left: Foot) [id number] - COTTON OSTEOTOMY (Left: Foot)  Patient location during evaluation: PACU Anesthesia Type: General Level of consciousness: awake and alert Pain management: pain level controlled Vital Signs Assessment: post-procedure vital signs reviewed and stable Respiratory status: spontaneous breathing, nonlabored ventilation and respiratory function stable Cardiovascular status: blood pressure returned to baseline and stable Postop Assessment: no apparent nausea or vomiting Anesthetic complications: yes   Encounter Notable Events  Notable Event Outcome Phase Comment  Difficult to intubate - expected  Intraprocedure Filed from anesthesia note documentation.     Last Vitals:  Vitals:   03/28/22 1200 03/28/22 1232  BP: 114/73 108/60  Pulse: 81   Resp:  16  Temp:    SpO2: 100%     Last Pain:  Vitals:   03/28/22 1232  TempSrc:   PainSc: 0-No pain                 Iran Ouch

## 2022-03-28 NOTE — Anesthesia Procedure Notes (Addendum)
Anesthesia Regional Block: Adductor canal block   Pre-Anesthetic Checklist: , timeout performed,  Correct Patient, Correct Site, Correct Laterality,  Correct Procedure, Correct Position, site marked,  Risks and benefits discussed,  Surgical consent,  Pre-op evaluation,  At surgeon's request and post-op pain management  Laterality: Upper and Left  Prep: chloraprep       Needles:  Injection technique: Single-shot  Needle Type: Stimiplex     Needle Length: 9cm  Needle Gauge: 22     Additional Needles:   Procedures:,,,, ultrasound used (permanent image in chart),,    Narrative:  Start time: 03/28/2022 7:31 AM End time: 03/28/2022 7:34 AM Injection made incrementally with aspirations every 5 mL.  Performed by: Personally  Anesthesiologist: [place] Ouch, MD  Additional Notes: Patient consented for risk and benefits of nerve block including but not limited to nerve damage, failed block, bleeding and infection.  Patient voiced understanding.  Functioning IV was confirmed and monitors were applied.  Timeout done prior to procedure and prior to any sedation being given to the patient.  Patient confirmed procedure site prior to any sedation given to the patient. Sterile prep,hand hygiene and sterile gloves were used.  Minimal sedation used for procedure.  No paresthesia endorsed by patient during the procedure.  Negative aspiration and negative test dose prior to incremental administration of local anesthetic. The patient tolerated the procedure well with no immediate complications.

## 2022-03-28 NOTE — Transfer of Care (Signed)
Immediate Anesthesia Transfer of Care Note  Patient: Valerie Braun  Procedure(s) Performed: 54627 - GASTROC RECESSION (Left: Foot) [phone] - FLEXOR DIGITORUM LONGUS (Left: Foot) CALCANEAL OSTEOTOMY (Left: Foot) 28305 - COTTON OSTEOTOMY (Left: Foot)  Patient Location: PACU  Anesthesia Type:General  Level of Consciousness: awake, drowsy, and patient cooperative  Airway & Oxygen Therapy: Patient Spontanous Breathing and Patient connected to face mask oxygen  Post-op Assessment: Report given to RN and Post -op Vital signs reviewed and stable  Post vital signs: Reviewed and stable  Last Vitals:  Vitals Value Taken Time  BP 110/68 03/28/22 1130  Temp    Pulse 83 03/28/22 1133  Resp 18 03/28/22 1133  SpO2 100 % 03/28/22 1133  Vitals shown include unvalidated device data.  Last Pain:  Vitals:   03/28/22 0635  TempSrc: Temporal  PainSc: 4          Complications:  Encounter Notable Events  Notable Event Outcome Phase Comment  Difficult to intubate - expected  Intraprocedure Filed from anesthesia note documentation.

## 2022-03-31 ENCOUNTER — Encounter: Payer: Self-pay | Admitting: Podiatry

## 2022-04-10 ENCOUNTER — Encounter: Payer: Self-pay | Admitting: Podiatry

## 2022-04-22 ENCOUNTER — Ambulatory Visit
Admission: RE | Admit: 2022-04-22 | Discharge: 2022-04-22 | Disposition: A | Discharge status: Home / Self Care | Payer: Federal, State, Local not specified - PPO | Source: Ambulatory Visit | Attending: Physician Assistant | Admitting: Physician Assistant

## 2022-04-22 DIAGNOSIS — Z1231 Encounter for screening mammogram for malignant neoplasm of breast: Secondary | ICD-10-CM | POA: Insufficient documentation

## 2022-04-24 ENCOUNTER — Other Ambulatory Visit: Payer: Self-pay | Admitting: Physician Assistant

## 2022-04-24 DIAGNOSIS — R921 Mammographic calcification found on diagnostic imaging of breast: Secondary | ICD-10-CM

## 2022-04-24 DIAGNOSIS — R928 Other abnormal and inconclusive findings on diagnostic imaging of breast: Secondary | ICD-10-CM

## 2022-04-25 ENCOUNTER — Encounter: Payer: Self-pay | Admitting: Podiatry

## 2022-04-25 ENCOUNTER — Ambulatory Visit
Admission: RE | Admit: 2022-04-25 | Discharge: 2022-04-25 | Disposition: A | Discharge status: Home / Self Care | Payer: Federal, State, Local not specified - PPO | Source: Ambulatory Visit | Attending: Physician Assistant | Admitting: Physician Assistant

## 2022-04-25 DIAGNOSIS — R928 Other abnormal and inconclusive findings on diagnostic imaging of breast: Secondary | ICD-10-CM | POA: Insufficient documentation

## 2022-04-25 DIAGNOSIS — R921 Mammographic calcification found on diagnostic imaging of breast: Secondary | ICD-10-CM | POA: Insufficient documentation

## 2022-05-29 DIAGNOSIS — Z1211 Encounter for screening for malignant neoplasm of colon: Secondary | ICD-10-CM

## 2022-05-29 DIAGNOSIS — K640 First degree hemorrhoids: Secondary | ICD-10-CM

## 2022-06-12 ENCOUNTER — Other Ambulatory Visit: Payer: Federal, State, Local not specified - PPO

## 2022-07-17 ENCOUNTER — Other Ambulatory Visit: Payer: Self-pay | Admitting: Physician Assistant

## 2022-07-17 DIAGNOSIS — R921 Mammographic calcification found on diagnostic imaging of breast: Secondary | ICD-10-CM

## 2022-10-28 ENCOUNTER — Ambulatory Visit
Admission: RE | Admit: 2022-10-28 | Discharge: 2022-10-28 | Disposition: A | Discharge status: Home / Self Care | Payer: Federal, State, Local not specified - PPO | Source: Ambulatory Visit | Attending: Physician Assistant | Admitting: Physician Assistant

## 2022-10-28 DIAGNOSIS — R921 Mammographic calcification found on diagnostic imaging of breast: Secondary | ICD-10-CM | POA: Diagnosis present

## 2023-03-09 ENCOUNTER — Other Ambulatory Visit: Payer: Self-pay | Admitting: Physician Assistant

## 2023-03-09 DIAGNOSIS — Z1231 Encounter for screening mammogram for malignant neoplasm of breast: Secondary | ICD-10-CM

## 2023-04-29 ENCOUNTER — Ambulatory Visit
Admission: RE | Admit: 2023-04-29 | Discharge: 2023-04-29 | Disposition: A | Discharge status: Home / Self Care | Payer: BC Managed Care – PPO | Source: Ambulatory Visit | Attending: Physician Assistant | Admitting: Physician Assistant

## 2023-04-29 DIAGNOSIS — Z1231 Encounter for screening mammogram for malignant neoplasm of breast: Secondary | ICD-10-CM | POA: Diagnosis present

## 2024-03-15 ENCOUNTER — Other Ambulatory Visit: Payer: Self-pay | Admitting: Physician Assistant

## 2024-03-15 DIAGNOSIS — Z1231 Encounter for screening mammogram for malignant neoplasm of breast: Secondary | ICD-10-CM

## 2024-05-03 ENCOUNTER — Ambulatory Visit
Admission: RE | Admit: 2024-05-03 | Discharge: 2024-05-03 | Disposition: A | Discharge status: Home / Self Care | Source: Ambulatory Visit | Attending: Physician Assistant | Admitting: Physician Assistant

## 2024-05-03 DIAGNOSIS — Z1231 Encounter for screening mammogram for malignant neoplasm of breast: Secondary | ICD-10-CM | POA: Diagnosis present
